# Patient Record
Sex: Female | Born: 1985
Health system: Southern US, Community
[De-identification: ages and names within clinical notes are randomized; demographics above are authoritative.]

## PROBLEM LIST (undated history)

## (undated) DIAGNOSIS — H9190 Unspecified hearing loss, unspecified ear: Secondary | ICD-10-CM

## (undated) DIAGNOSIS — Z9889 Other specified postprocedural states: Secondary | ICD-10-CM

## (undated) DIAGNOSIS — J029 Acute pharyngitis, unspecified: Secondary | ICD-10-CM

## (undated) DIAGNOSIS — F419 Anxiety disorder, unspecified: Secondary | ICD-10-CM

## (undated) DIAGNOSIS — Z8619 Personal history of other infectious and parasitic diseases: Secondary | ICD-10-CM

## (undated) DIAGNOSIS — R112 Nausea with vomiting, unspecified: Secondary | ICD-10-CM

## (undated) HISTORY — DX: Unspecified hearing loss, unspecified ear: H91.90

## (undated) HISTORY — PX: TYMPANOSTOMY TUBE PLACEMENT: SHX32

## (undated) HISTORY — DX: Personal history of other infectious and parasitic diseases: Z86.19

## (undated) HISTORY — PX: LINGUAL FRENECTOMY: SHX6357

## (undated) HISTORY — PX: WISDOM TOOTH EXTRACTION: SHX21

---

## 2005-03-16 ENCOUNTER — Emergency Department (HOSPITAL_COMMUNITY): Admission: EM | Admit: 2005-03-16 | Discharge: 2005-03-16 | Payer: Self-pay | Admitting: Emergency Medicine

## 2010-03-02 ENCOUNTER — Encounter: Payer: Self-pay | Admitting: Cardiology

## 2010-04-16 ENCOUNTER — Encounter: Payer: Self-pay | Admitting: Cardiology

## 2010-09-05 ENCOUNTER — Encounter: Payer: Self-pay | Admitting: Cardiology

## 2011-06-21 LAB — STREP B DNA PROBE: GBS: POSITIVE

## 2011-06-21 LAB — GC/CHLAMYDIA PROBE AMP, GENITAL: Gonorrhea: NEGATIVE

## 2011-08-08 ENCOUNTER — Encounter: Payer: Self-pay | Admitting: Cardiology

## 2011-11-20 ENCOUNTER — Inpatient Hospital Stay (HOSPITAL_COMMUNITY): Admission: AD | Admit: 2011-11-20 | Payer: Self-pay | Source: Ambulatory Visit | Admitting: Obstetrics and Gynecology

## 2011-11-21 NOTE — L&D Delivery Note (Signed)
Delivery Note  Pt began to have more urge to push about 2030, by 2130 cx was complete and pt pushed well in tub, in SF position, vertex began to  Crown, FHR remained stable at 140's intermittently.   At 10:49 PM a viable female was delivered via Vaginal, Spontaneous Delivery (Presentation: LOA  ). Shoulders followed easily and delivered through loose nuchal cord, infant did not initially respond to tactile stim, an dcord was clamped and cut by FOB and taken to warmer.  APGAR: 2, 7; weight 5 lb 15.8 oz (2716 g).   Placenta status: Intact, Spontaneous.  Cord: 3 vessels with the following complications: None.   Routine cord blood collected  Anesthesia: Local  Episiotomy: None Lacerations: bilateral Labial;1st degree;Perineal Suture Repair: 3.0 vicryl rapide Est. Blood Loss (mL): 200  Mom to postpartum.  Baby to nursery-stable. Taken to central nursery for further observation Mom plans to BF and plans IP circumcision Dr Estanislado Pandy notified  Malissa Hippo 01/28/2012, 12:53 AM

## 2011-12-22 ENCOUNTER — Encounter: Payer: Self-pay | Admitting: Cardiology

## 2012-01-19 ENCOUNTER — Encounter (INDEPENDENT_AMBULATORY_CARE_PROVIDER_SITE_OTHER): Payer: BC Managed Care – PPO

## 2012-01-19 DIAGNOSIS — Z331 Pregnant state, incidental: Secondary | ICD-10-CM

## 2012-01-25 ENCOUNTER — Encounter (INDEPENDENT_AMBULATORY_CARE_PROVIDER_SITE_OTHER): Payer: BC Managed Care – PPO | Admitting: Obstetrics and Gynecology

## 2012-01-25 DIAGNOSIS — Z331 Pregnant state, incidental: Secondary | ICD-10-CM

## 2012-01-27 ENCOUNTER — Inpatient Hospital Stay (HOSPITAL_COMMUNITY)
Admission: AD | Admit: 2012-01-27 | Discharge: 2012-01-29 | DRG: 373 | Disposition: A | Discharge status: Home / Self Care | Payer: BC Managed Care – PPO | Source: Ambulatory Visit | Attending: Obstetrics and Gynecology | Admitting: Obstetrics and Gynecology

## 2012-01-27 ENCOUNTER — Encounter (HOSPITAL_COMMUNITY): Payer: Self-pay | Admitting: *Deleted

## 2012-01-27 DIAGNOSIS — IMO0002 Reserved for concepts with insufficient information to code with codable children: Secondary | ICD-10-CM

## 2012-01-27 DIAGNOSIS — Z22330 Carrier of Group B streptococcus: Secondary | ICD-10-CM

## 2012-01-27 DIAGNOSIS — O99892 Other specified diseases and conditions complicating childbirth: Secondary | ICD-10-CM | POA: Diagnosis present

## 2012-01-27 LAB — HEPATITIS B SURFACE ANTIGEN: Hepatitis B Surface Ag: NEGATIVE

## 2012-01-27 LAB — CBC
HCT: 35.3 % — ABNORMAL LOW (ref 36.0–46.0)
Hemoglobin: 12.1 g/dL (ref 12.0–15.0)
MCH: 31.8 pg (ref 26.0–34.0)
MCHC: 34.3 g/dL (ref 30.0–36.0)
RBC: 3.8 MIL/uL — ABNORMAL LOW (ref 3.87–5.11)

## 2012-01-27 LAB — RPR
RPR Ser Ql: NONREACTIVE
RPR: NONREACTIVE

## 2012-01-27 MED ORDER — BUTORPHANOL TARTRATE 2 MG/ML IJ SOLN: 1.0000 mg | INTRAMUSCULAR | Status: DC | PRN

## 2012-01-27 MED ORDER — OXYTOCIN BOLUS FROM INFUSION
500.0000 mL | Freq: Once | INTRAVENOUS | Status: DC
  Filled 2012-01-27: qty 500
  Filled 2012-01-27: qty 1000

## 2012-01-27 MED ORDER — OXYCODONE-ACETAMINOPHEN 5-325 MG PO TABS: 1.0000 | ORAL_TABLET | ORAL | Status: DC | PRN

## 2012-01-27 MED ORDER — VANCOMYCIN HCL IN DEXTROSE 1-5 GM/200ML-% IV SOLN
1000.0000 mg | Freq: Two times a day (BID) | INTRAVENOUS | Status: DC
  Administered 2012-01-27: 1000 mg via INTRAVENOUS
  Filled 2012-01-27 (×3): qty 200

## 2012-01-27 MED ORDER — IBUPROFEN 600 MG PO TABS
600.0000 mg | ORAL_TABLET | Freq: Four times a day (QID) | ORAL | Status: DC | PRN
  Administered 2012-01-27: 600 mg via ORAL
  Filled 2012-01-27: qty 1

## 2012-01-27 MED ORDER — OXYTOCIN 10 UNIT/ML IJ SOLN
INTRAMUSCULAR | Status: AC
  Filled 2012-01-27: qty 2

## 2012-01-27 MED ORDER — ACETAMINOPHEN 325 MG PO TABS: 650.0000 mg | ORAL_TABLET | ORAL | Status: DC | PRN

## 2012-01-27 MED ORDER — LIDOCAINE HCL (PF) 1 % IJ SOLN
30.0000 mL | INTRAMUSCULAR | Status: DC | PRN
  Administered 2012-01-27: 30 mL via SUBCUTANEOUS
  Filled 2012-01-27: qty 30

## 2012-01-27 MED ORDER — LACTATED RINGERS IV SOLN: 500.0000 mL | INTRAVENOUS | Status: DC | PRN

## 2012-01-27 MED ORDER — OXYTOCIN 20 UNITS IN LACTATED RINGERS INFUSION - SIMPLE: 125.0000 mL/h | Freq: Once | INTRAVENOUS | Status: DC

## 2012-01-27 MED ORDER — FLEET ENEMA 7-19 GM/118ML RE ENEM: 1.0000 | ENEMA | RECTAL | Status: DC | PRN

## 2012-01-27 MED ORDER — PROMETHAZINE HCL 25 MG/ML IJ SOLN: 12.5000 mg | INTRAMUSCULAR | Status: DC | PRN

## 2012-01-27 MED ORDER — CITRIC ACID-SODIUM CITRATE 334-500 MG/5ML PO SOLN: 30.0000 mL | ORAL | Status: DC | PRN

## 2012-01-27 MED ORDER — ONDANSETRON HCL 4 MG/2ML IJ SOLN: 4.0000 mg | Freq: Four times a day (QID) | INTRAMUSCULAR | Status: DC | PRN

## 2012-01-27 MED ORDER — LACTATED RINGERS IV SOLN
INTRAVENOUS | Status: DC
  Administered 2012-01-27: 14:00:00 via INTRAVENOUS

## 2012-01-27 NOTE — Progress Notes (Signed)
Pt in tub-temp 98 degrees

## 2012-01-27 NOTE — Progress Notes (Signed)
Pt in pool--water temp 97

## 2012-01-27 NOTE — Progress Notes (Signed)
Water Temp 96

## 2012-01-27 NOTE — Progress Notes (Signed)
Calm, quiet in tub O FHTS +     BP 124/66  Pulse 82  Temp(Src) 97.8 F (36.6 C) (Oral)  Resp 18  Ht 5' (1.524 m)  Wt 62.143 kg (137 lb)  BMI 26.76 kg/m2 Hemoglobin & Hematocrit     Component Value Date/Time   HGB 12.1 01/27/2012 1330   HCT 35.3* 01/27/2012 1330   UC q 2-3 Vag not assessed A 39 1/7 week IUP labor P continue care Lavera Guise, CNM

## 2012-01-27 NOTE — H&P (Signed)
Kimberly Chambers is a 26 y.o. female presenting with srom at 0900, uc pain level 4, denies vag bleeding, with +FM, plans water birth. Allergy: augmentin and ceclor- rash and throat edema with both History OB History    Grav Para Term Preterm Abortions TAB SAB Ect Mult Living   1              Past Medical History  Diagnosis Date  . No pertinent past medical history    Past Surgical History  Procedure Date  . Wisdom tooth extraction    Family History: family history is not on file. Social History:  reports that she has quit smoking. She has never used smokeless tobacco. She reports that she does not drink alcohol or use illicit drugs.  ROS  Dilation: 3.5 Effacement (%): 100 Station: -2;-1 Exam by:: M. Chiquita Loth CNM Blood pressure 124/84, pulse 110, temperature 98 F (36.7 C), temperature source Oral, resp. rate 16, height 5' (1.524 m), weight 62.143 kg (137 lb). Exam Physical Exam alert, cooperative, lungs clear bilaterally, AP RRR, abd soft, gravid, nt SSE bloody mucus seen, vag 3 100 -1/-2 VTX no membranes palpated, 2 cm clot with exam Prenatal labs: 1 gtt 161, 3 gtt WNL ABO, Rh:  A+ Antibody:  neg Rubella:  I RPR:   NR HBsAg:   Neg HIV:   NR GBS:   positive urine  Assessment/Plan: 39 1/7 week IUP SROM GBS+ P IV vancomycin, observe for bleeding through admission it without further bleeding then intermittent fhts, collaboration with Dr. Estanislado Pandy.  Chizuko Trine 01/27/2012, 2:16 PM

## 2012-01-27 NOTE — Progress Notes (Signed)
Calm quiet with uc O Fhts +     uc q 2-3 mod     abd soft, gravid, nt     Vag 6 100 0 VTX R A active labor P continue care Lavera Guise, CNM

## 2012-01-27 NOTE — Progress Notes (Signed)
Patient ID: Kimberly Chambers, female   DOB: 08/16/1986, 26 y.o.   MRN: [id number] .Subjective: Doing well, pushing, in the tub, husband and mother in room, drinking PO   Objective: BP 142/83  Pulse 114  Temp(Src) 98.7 F (37.1 C) (Oral)  Resp 20  Ht 5' (1.524 m)  Wt 62.143 kg (137 lb)  BMI 26.76 kg/m2   FHT:  125 intermittent UC:   regular, every 1-2 minutes SVE:   Dilation: 10 Effacement (%): 100 Station: +1 Exam by:: Tristy Udovich cnm    Assessment / Plan: Spontaneous labor, progressing normally GBS pos, rcv'd vanc   Fetal Wellbeing:  Category I Pain Control:  Labor support without medications  Update physician PRN  Hatim Homann M 01/27/2012, 9:32 PM

## 2012-01-27 NOTE — Progress Notes (Signed)
C/o urge to push O Fhts +     uc q 2-3 od     Vag 5 100 -1/0 no vag bleeding A active labor P continue care Lavera Guise, CNM

## 2012-01-28 ENCOUNTER — Encounter (HOSPITAL_COMMUNITY): Payer: Self-pay | Admitting: *Deleted

## 2012-01-28 DIAGNOSIS — Z22330 Carrier of Group B streptococcus: Secondary | ICD-10-CM

## 2012-01-28 LAB — CBC
HCT: 32.7 % — ABNORMAL LOW (ref 36.0–46.0)
Hemoglobin: 11.4 g/dL — ABNORMAL LOW (ref 12.0–15.0)
MCH: 32 pg (ref 26.0–34.0)
MCHC: 34.9 g/dL (ref 30.0–36.0)
MCV: 91.9 fL (ref 78.0–100.0)
Platelets: 237 10*3/uL (ref 150–400)
RBC: 3.56 MIL/uL — ABNORMAL LOW (ref 3.87–5.11)
RDW: 12.6 % (ref 11.5–15.5)
WBC: 44.5 10*3/uL — ABNORMAL HIGH (ref 4.0–10.5)

## 2012-01-28 MED ORDER — DIBUCAINE 1 % RE OINT: 1.0000 "application " | TOPICAL_OINTMENT | RECTAL | Status: DC | PRN

## 2012-01-28 MED ORDER — PRENATAL MULTIVITAMIN CH
1.0000 | ORAL_TABLET | Freq: Every day | ORAL | Status: DC
  Administered 2012-01-28 – 2012-01-29 (×2): 1 via ORAL
  Filled 2012-01-28 (×2): qty 1

## 2012-01-28 MED ORDER — DIPHENHYDRAMINE HCL 25 MG PO CAPS: 25.0000 mg | ORAL_CAPSULE | Freq: Four times a day (QID) | ORAL | Status: DC | PRN

## 2012-01-28 MED ORDER — ONDANSETRON HCL 4 MG/2ML IJ SOLN: 4.0000 mg | INTRAMUSCULAR | Status: DC | PRN

## 2012-01-28 MED ORDER — LANOLIN HYDROUS EX OINT: TOPICAL_OINTMENT | CUTANEOUS | Status: DC | PRN

## 2012-01-28 MED ORDER — WITCH HAZEL-GLYCERIN EX PADS: 1.0000 "application " | MEDICATED_PAD | CUTANEOUS | Status: DC | PRN

## 2012-01-28 MED ORDER — BENZOCAINE-MENTHOL 20-0.5 % EX AERO
INHALATION_SPRAY | CUTANEOUS | Status: AC
  Administered 2012-01-28: 04:00:00
  Filled 2012-01-28: qty 56

## 2012-01-28 MED ORDER — MEASLES, MUMPS & RUBELLA VAC ~~LOC~~ INJ
0.5000 mL | INJECTION | Freq: Once | SUBCUTANEOUS | Status: DC
  Filled 2012-01-28: qty 0.5

## 2012-01-28 MED ORDER — IBUPROFEN 600 MG PO TABS
600.0000 mg | ORAL_TABLET | Freq: Four times a day (QID) | ORAL | Status: DC
  Administered 2012-01-28 – 2012-01-29 (×6): 600 mg via ORAL
  Filled 2012-01-28 (×6): qty 1

## 2012-01-28 MED ORDER — SENNOSIDES-DOCUSATE SODIUM 8.6-50 MG PO TABS
2.0000 | ORAL_TABLET | Freq: Every day | ORAL | Status: DC
  Administered 2012-01-29: 2 via ORAL

## 2012-01-28 MED ORDER — LORATADINE 10 MG PO TABS
10.0000 mg | ORAL_TABLET | Freq: Every day | ORAL | Status: DC
  Filled 2012-01-28 (×3): qty 1

## 2012-01-28 MED ORDER — SIMETHICONE 80 MG PO CHEW: 80.0000 mg | CHEWABLE_TABLET | ORAL | Status: DC | PRN

## 2012-01-28 MED ORDER — ZOLPIDEM TARTRATE 5 MG PO TABS: 5.0000 mg | ORAL_TABLET | Freq: Every evening | ORAL | Status: DC | PRN

## 2012-01-28 MED ORDER — OXYCODONE-ACETAMINOPHEN 5-325 MG PO TABS: 1.0000 | ORAL_TABLET | ORAL | Status: DC | PRN

## 2012-01-28 MED ORDER — TETANUS-DIPHTH-ACELL PERTUSSIS 5-2.5-18.5 LF-MCG/0.5 IM SUSP: 0.5000 mL | Freq: Once | INTRAMUSCULAR | Status: DC

## 2012-01-28 MED ORDER — BENZOCAINE-MENTHOL 20-0.5 % EX AERO: 1.0000 "application " | INHALATION_SPRAY | CUTANEOUS | Status: DC | PRN

## 2012-01-28 MED ORDER — MENTHOL 3 MG MT LOZG
1.0000 | LOZENGE | OROMUCOSAL | Status: DC | PRN
  Filled 2012-01-28: qty 9

## 2012-01-28 MED ORDER — ONDANSETRON HCL 4 MG PO TABS: 4.0000 mg | ORAL_TABLET | ORAL | Status: DC | PRN

## 2012-01-28 NOTE — Progress Notes (Signed)
Comfortable except for cramping with pumping, little bleeding, baby in NICU O VSS     abd soft, nt, ff sm rubra flow, perineum clean intact, no edema, -Homan's sign bilaterally      Hemoglobin & Hematocrit     Component Value Date/Time   HGB 11.4* 01/28/2012 0537   HCT 32.7* 01/28/2012 0537   A pp day 1    Lactating     Normal involution P repeat CBC, encouraged frequent voids. Lavera Guise, CNM

## 2012-01-29 LAB — DIFFERENTIAL
Basophils Absolute: 0 10*3/uL (ref 0.0–0.1)
Basophils Relative: 0 % (ref 0–1)
Lymphocytes Relative: 12 % (ref 12–46)
Neutro Abs: 20.2 10*3/uL — ABNORMAL HIGH (ref 1.7–7.7)
Neutrophils Relative %: 80 % — ABNORMAL HIGH (ref 43–77)

## 2012-01-29 LAB — CBC
MCHC: 34.6 g/dL (ref 30.0–36.0)
Platelets: 196 10*3/uL (ref 150–400)
RDW: 12.9 % (ref 11.5–15.5)
WBC: 25.3 10*3/uL — ABNORMAL HIGH (ref 4.0–10.5)

## 2012-01-29 MED ORDER — IBUPROFEN 600 MG PO TABS: 600.0000 mg | ORAL_TABLET | Freq: Four times a day (QID) | ORAL | Status: AC | PRN

## 2012-01-29 MED ORDER — FERROUS SULFATE 325 (65 FE) MG PO TABS: 325.0000 mg | ORAL_TABLET | Freq: Every day | ORAL | Status: AC

## 2012-01-29 NOTE — Discharge Instructions (Signed)
Vaginal Delivery Care After  Change your pad on each trip to the bathroom.   Wipe gently with toilet paper during your hospital stay. Always wipe from front to back. A spray bottle with warm tap water could also be used or a towelette if available.   Place your soiled pad and toilet paper in a bathroom wastebasket with a plastic bag liner.   During your hospital stay, save any clots. If you pass a clot while on the toilet, do not flush it. Also, if your vaginal flow seems excessive to you, notify nursing personnel.   The first time you get out of bed after delivery, wait for assistance from a nurse. Do not get up alone at any time if you feel weak or dizzy.   Bend and extend your ankles forcefully so that you feel the calves of your legs get hard. Do this 6 times every hour when you are in bed and awake.   Do not sit with one foot under you, dangle your legs over the edge of the bed, or maintain a position that hinders the circulation in your legs.   Many women experience after pains for 2 to 3 days after delivery. These after pains are mild uterine contractions. Ask the nurse for a pain medication if you need something for this. Sometimes breastfeeding stimulates after pains; if you find this to be true, ask for the medication  -  hour before the next feeding.   For you and your infant's protection, do not go beyond the door(s) of the obstetric unit. Do not carry your baby in your arms in the hallway. When taking your baby to and from your room, put your baby in the bassinet and push the bassinet.   Mothers may have their babies in their room as much as they desire.   Breastfeeding BENEFITS OF BREASTFEEDING For the baby  The first milk (colostrum) helps the baby's digestive system function better.   There are antibodies from the mother in the milk that help the baby fight off infections.   The baby has a lower incidence of asthma, allergies, and SIDS (sudden infant death syndrome).     The nutrients in breast milk are better than formulas for the baby and helps the baby's brain grow better.   Babies who breastfeed have less gas, colic, and constipation.  For the mother  Breastfeeding helps develop a very special bond between mother and baby.   It is more convenient, always available at the correct temperature and cheaper than formula feeding.   It burns calories in the mother and helps with losing weight that was gained during pregnancy.   It makes the uterus contract back down to normal size faster and slows bleeding following delivery.   Breastfeeding mothers have a lower risk of developing breast cancer.  NURSE FREQUENTLY  A healthy, full-term baby may breastfeed as often as every hour or space his or her feedings to every 3 hours.   How often to nurse will vary from baby to baby. Watch your baby for signs of hunger, not the clock.   Nurse as often as the baby requests, or when you feel the need to reduce the fullness of your breasts.   Awaken the baby if it has been 3 to 4 hours since the last feeding.   Frequent feeding will help the mother make more milk and will prevent problems like sore nipples and engorgement of the breasts.  BABY'S POSITION AT THE BREAST    Whether lying down or sitting, be sure that the baby's tummy is facing your tummy.   Support the breast with 4 fingers underneath the breast and the thumb above. Make sure your fingers are well away from the nipple and baby's mouth.   Stroke the baby's lips and cheek closest to the breast gently with your finger or nipple.   When the baby's mouth is open wide enough, place all of your nipple and as much of the dark area around the nipple as possible into your baby's mouth.   Pull the baby in close so the tip of the nose and the baby's cheeks touch the breast during the feeding.  FEEDINGS  The length of each feeding varies from baby to baby and from feeding to feeding.   The baby must suck  about 2 to 3 minutes for your milk to get to him or her. This is called a "let down." For this reason, allow the baby to feed on each breast as long as he or she wants. Your baby will end the feeding when he or she has received the right balance of nutrients.   To break the suction, put your finger into the corner of the baby's mouth and slide it between his or her gums before removing your breast from his or her mouth. This will help prevent sore nipples.  REDUCING BREAST ENGORGEMENT  In the first week after your baby is born, you may experience signs of breast engorgement. When breasts are engorged, they feel heavy, warm, full, and may be tender to the touch. You can reduce engorgement if you:   Nurse frequently, every 2 to 3 hours. Mothers who breastfeed early and often have fewer problems with engorgement.   Place light ice packs on your breasts between feedings. This reduces swelling. Wrap the ice packs in a lightweight towel to protect your skin.   Apply moist hot packs to your breast for 5 to 10 minutes before each feeding. This increases circulation and helps the milk flow.   Gently massage your breast before and during the feeding.   Make sure that the baby empties at least one breast at every feeding before switching sides.   Use a breast pump to empty the breasts if your baby is sleepy or not nursing well. You may also want to pump if you are returning to work or or you feel you are getting engorged.   Avoid bottle feeds, pacifiers or supplemental feedings of water or juice in place of breastfeeding.   Be sure the baby is latched on and positioned properly while breastfeeding.   Prevent fatigue, stress, and anemia.   Wear a supportive bra, avoiding underwire styles.   Eat a balanced diet with enough fluids.  If you follow these suggestions, your engorgement should improve in 24 to 48 hours. If you are still experiencing difficulty, call your lactation consultant or  caregiver. IS MY BABY GETTING ENOUGH MILK? Sometimes, mothers worry about whether their babies are getting enough milk. You can be assured that your baby is getting enough milk if:  The baby is actively sucking and you hear swallowing.   The baby nurses at least 8 to 12 times in a 24 hour time period. Nurse your baby until he or she unlatches or falls asleep at the first breast (at least 10 to 20 minutes), then offer the second side.   The baby is wetting 5 to 6 disposable diapers (6 to 8 cloth diapers) in a   24 hour period by 5 to 6 days of age.   The baby is having at least 2 to 3 stools every 24 hours for the first few months. Breast milk is all the food your baby needs. It is not necessary for your baby to have water or formula. In fact, to help your breasts make more milk, it is best not to give your baby supplemental feedings during the early weeks.   The stool should be soft and yellow.   The baby should gain 4 to 7 ounces per week after he is 4 days old.  TAKE CARE OF YOURSELF Take care of your breasts by:  Bathing or showering daily.   Avoiding the use of soaps on your nipples.   Start feedings on your left breast at one feeding and on your right breast at the next feeding.   You will notice an increase in your milk supply 2 to 5 days after delivery. You may feel some discomfort from engorgement, which makes your breasts very firm and often tender. Engorgement "peaks" out within 24 to 48 hours. In the meantime, apply warm moist towels to your breasts for 5 to 10 minutes before feeding. Gentle massage and expression of some milk before feeding will soften your breasts, making it easier for your baby to latch on. Wear a well fitting nursing bra and air dry your nipples for 10 to 15 minutes after each feeding.   Only use cotton bra pads.   Only use pure lanolin on your nipples after nursing. You do not need to wash it off before nursing.  Take care of yourself by:   Eating  well-balanced meals and nutritious snacks.   Drinking milk, fruit juice, and water to satisfy your thirst (about 8 glasses a day).   Getting plenty of rest.   Increasing calcium in your diet (1200 mg a day).   Avoiding foods that you notice affect the baby in a bad way.  SEEK MEDICAL CARE IF:   You have any questions or difficulty with breastfeeding.   You need help.   You have a hard, red, sore area on your breast, accompanied by a fever of 100.5 F (38.1 C) or more.   Your baby is too sleepy to eat well or is having trouble sleeping.   Your baby is wetting less than 6 diapers per day, by 5 days of age.   Your baby's skin or white part of his or her eyes is more yellow than it was in the hospital.   You feel depressed.  Document Released: 11/06/2005 Document Revised: 10/26/2011 Document Reviewed: 06/21/2009 ExitCare Patient Information 2012 [employer]. 

## 2012-01-29 NOTE — Discharge Summary (Signed)
  Obstetric Discharge Summary Reason for Admission: rupture of membranes Prenatal Procedures: ultrasound Intrapartum Procedures: spontaneous vaginal delivery Postpartum Procedures: antibiotics Complications-Operative and Postpartum: bilateral labial and first  degree perineal laceration  Temp:  [97.6 F (36.4 C)-98.6 F (37 C)] 98.4 F (36.9 C) (03/11 2952) Pulse Rate:  [79-92] 79  (03/11 0608) Resp:  [18-20] 18  (03/11 0608) BP: (97-126)/(64-82) 97/64 mmHg (03/11 0608) Hemoglobin  Date Value Range Status  01/29/2012 9.9* 12.0-15.0 (g/dL) Final     HCT  Date Value Range Status  01/29/2012 28.6* 36.0-46.0 (%) Final   S: Patient stable denies complaints. Afebrile, without signs or symptoms of infection noted. Pumping. Baby in NICU.   O: VSS  Assessment/Plan General: alert, oriented, coping well Lochia: scant, fundus firm 3 below umbilicus Negative Homan's- no signs of DVT +1 edema noted BLE RTO x 6 weeks for PP exam-call for appt. Declines contraception presently-may decide Micronor Other BCM options discussed Nothing in vagina x 6 weeks   Hospital Course:  Hospital Course: Admitted after SROM. Positive GBS. Progressed to fully dilated. Delivery was performed by Gevena Barre, CNM without difficulty. Patient and baby tolerated the procedure without difficulty, with a bilateral labial laceration and 1st degree perineal laceration noted. Infant to nursery, later to NICU. Mother and infant then had an uncomplicated postpartum course, with pumping going well. Mom's physical exam was WNL, and she was discharged home in stable condition. Contraception plan was abstinence.  She received adequate benefit from po pain medications.  Discharge Diagnoses: Term Pregnancy-delivered  Discharge Information: Date: 01/29/2012 Activity: nothing in vagina x 6 weeks Diet: routine Medications:  Medication List  As of 01/29/2012  9:25 AM   START taking these medications         ferrous sulfate 325  (65 FE) MG tablet   Take 1 tablet (325 mg total) by mouth daily with breakfast.      ibuprofen 600 MG tablet   Commonly known as: ADVIL,MOTRIN   Take 1 tablet (600 mg total) by mouth every 6 (six) hours as needed for pain.         CONTINUE taking these medications         cetirizine 10 MG tablet   Commonly known as: ZYRTEC      prenatal multivitamin Tabs          Where to get your medications    These are the prescriptions that you need to pick up.   You may get these medications from any pharmacy.         ferrous sulfate 325 (65 FE) MG tablet   ibuprofen 600 MG tablet           Condition: stable Instructions: refer to practice specific booklet Discharge to: home Follow-up Information    Follow up with [facility]. Schedule an appointment as soon as possible for a visit in 6 weeks.         Newborn Data: Live born  Information for the patient's newborn:  Bade, Boy Arnitra [[id number]]  female ; APGAR 2,7 ; weight 5lbs. 15.8 oz  Baby in NICU  Kizzie Fantasia CORI 01/29/2012, 9:25 AM  Agree with above - AYR

## 2012-01-29 NOTE — Progress Notes (Signed)
CLINICAL SOCIAL WORK  BRIEF PSYCHOSOCIAL ASSESSMENT  Referred by: NICU     On: 01/29/12      For: NICU support     Patient Interview _X_Family Interview: parents     Other: MOB's chart   PSYCHOSOCIAL DATA:   Lives Alone  Lives with: baby to be discharged to parents' home  Primary Support (Name/Relationship): Kimberly Chambers/parents Degree of support available:   CURRENT CONCERNS:     _X_None noted Substance Abuse     Behavioral Health Issues    Financial Resources     Abuse/Neglect/Domestic Violence   Cultural/Religious Issues     Post-Acute Placement    Adjustment to Illness     Knowledge/Cognitive Deficit      Other:     SOCIAL WORK ASSESSMENT/PLAN:  SW met with parents in MOB's first floor room to introduce myself, complete assessment and evaluate how they are coping with baby's admission to NICU.  SW explained support services offered by NICU SW and asked if parents have any questions or needs at this time.  SW left messages at the [facility] Department regarding parents' question about home post partum nurse follow up program.  SW also left message for Coordinator of that program in [place] for information.  SW has no social concerns at this time and does not identify any barriers to discharge.  No Further Intervention Required  _X_Psychosocial Support/Ongoing Assessment of Needs Information/Referral to [facility] Other  PATIENT'S/FAMILY'S RESPONSE TO PLAN OF CARE:  Parents were very friendly and state they think baby will be able to go home tomorrow and that they will be able to room in tonight since it is MOB's day of discharge.  They state no concerns or needs and only question was regarding PP nurse follow up program.

## 2012-01-31 ENCOUNTER — Encounter: Payer: BC Managed Care – PPO | Admitting: Obstetrics and Gynecology

## 2012-02-02 ENCOUNTER — Encounter (HOSPITAL_COMMUNITY): Payer: Self-pay

## 2012-03-14 ENCOUNTER — Ambulatory Visit (INDEPENDENT_AMBULATORY_CARE_PROVIDER_SITE_OTHER): Payer: BC Managed Care – PPO | Admitting: Obstetrics and Gynecology

## 2012-03-14 ENCOUNTER — Encounter: Payer: Self-pay | Admitting: Obstetrics and Gynecology

## 2012-03-14 DIAGNOSIS — Z309 Encounter for contraceptive management, unspecified: Secondary | ICD-10-CM | POA: Insufficient documentation

## 2012-03-14 NOTE — Patient Instructions (Signed)
Intrauterine Device Information An intrauterine device (IUD) is inserted into your uterus and prevents pregnancy. There are 2 types of IUDs available:  Copper IUD. This type of IUD is wrapped in copper wire and is placed inside the uterus. Copper makes the uterus and fallopian tubes produce a fluid that kills sperm. The copper IUD can stay in place for 10 years.   Hormone IUD. This type of IUD contains the hormone progestin (synthetic progesterone). The hormone thickens the cervical mucus and prevents sperm from entering the uterus, and it also thins the uterine lining to prevent implantation of a fertilized egg. The hormone can weaken or kill the sperm that get into the uterus. The hormone IUD can stay in place for 5 years.  Your caregiver will make sure you are a good candidate for a contraceptive IUD. Discuss with your caregiver the possible side effects. ADVANTAGES  It is highly effective, reversible, long-acting, and low maintenance.   There are no estrogen-related side effects.   An IUD can be used when breastfeeding.   It is not associated with weight gain.   It works immediately after insertion.   The copper IUD does not interfere with your female hormones.   The progesterone IUD can make heavy menstrual periods lighter.   The progesterone IUD can be used for 5 years.   The copper IUD can be used for 10 years.  DISADVANTAGES  The progesterone IUD can be associated with irregular bleeding patterns.   The copper IUD can make your menstrual flow heavier and more painful.   You may experience cramping and vaginal bleeding after insertion.  Document Released: 10/10/2004 Document Revised: 10/26/2011 Document Reviewed: 03/11/2011 ExitCare Patient Information 2012 [employer]. 

## 2012-03-14 NOTE — Progress Notes (Deleted)
DATE OR SURGERY: January 27, 2012 PAIN:{EXAM; YES/NO:19492::"No"} VAG BLEEDING: {yes no:314532} VAG DISCHARGE: {yes no:314532} NORMAL GI FUNCTN: {yes no:314532} NORMAL GU FUNCTN: {yes ZO:[id number]}

## 2012-03-14 NOTE — Progress Notes (Signed)
Date of delivery: January 27, 2012 Female Name: Kimberly Chambers Vaginal delivery:yes Cesarean section:no Tubal ligation:no GDM:no Breast Feeding:yes Bottle Feeding:yes Breastmilk Post-Partum Blues:no Abnormal pap:no Normal GU function: yes Normal GI function:yes Returning to work:yes Aug. 2013 Desires BC w/o hormones wants MD suggestion, BC option lists given today EPDS 6  Subjective:     Kimberly Chambers is a 26 y.o. female who presents for a postpartum visit.  I have fully reviewed the prenatal and intrapartum course.    Patient is sexually active.   The following portions of the patient's history were reviewed and updated as appropriate: allergies, current medications, past family history, past medical history, past social history, past surgical history and problem list.  Review of Systems Pertinent items are noted in HPI.   Objective:    BP 88/58  Wt 121 lb (54.885 kg)  Breastfeeding? Yes  General:  alert, cooperative and no distress     Lungs: clear to auscultation bilaterally  Heart:  regular rate and rhythm, S1, S2 normal, no murmur  Abdomen: soft, non-tender; bowel sounds normal; no masses,  no organomegaly   Vulva:  normal  Vagina: normal vagina  Cervix:  normal  Corpus: normal size, contour, position, consistency, mobility, non-tender  Adnexa:  normal adnexa             Assessment:     Normal postpartum exam.  Pap smear not done at today's visit due January 2014.   Plan:    1. Contraception: After discussion of options for non-hormone containing contraceptives, pt wants Paragard IUD 2.. Follow up for IUD insertion 3.  Written info given    Kimberly Morales MD 03/14/2012 6:02 PM

## 2012-03-27 ENCOUNTER — Ambulatory Visit (INDEPENDENT_AMBULATORY_CARE_PROVIDER_SITE_OTHER): Payer: BC Managed Care – PPO | Admitting: Obstetrics and Gynecology

## 2012-03-27 ENCOUNTER — Encounter: Payer: Self-pay | Admitting: Obstetrics and Gynecology

## 2012-03-27 VITALS — BP 92/62 | HR 60 | Ht 60.0 in | Wt 117.0 lb

## 2012-03-27 DIAGNOSIS — B1910 Unspecified viral hepatitis B without hepatic coma: Secondary | ICD-10-CM

## 2012-03-27 DIAGNOSIS — Z30430 Encounter for insertion of intrauterine contraceptive device: Secondary | ICD-10-CM

## 2012-03-27 DIAGNOSIS — Z975 Presence of (intrauterine) contraceptive device: Secondary | ICD-10-CM

## 2012-03-27 DIAGNOSIS — Z8619 Personal history of other infectious and parasitic diseases: Secondary | ICD-10-CM

## 2012-03-27 LAB — POCT URINE PREGNANCY: Preg Test, Ur: NEGATIVE

## 2012-03-27 NOTE — Patient Instructions (Signed)
Schedule follow up appointment in 4 weeks  Call [facility] [phone]:  -for temperature of 100.4 degrees Fahrenheit or more -pain not improved with over the counter pain medications (Ibuprofen, Advil, Aleve,        Tylenol or acetaminophen) -for excessive bleeding (more than a usual period) -for any other concerns  Do not place anything in your vagina for the next 7 days

## 2012-03-27 NOTE — Progress Notes (Signed)
IUD INSERTION NOTE Kimberly Chambers is a 26 y.o. female G1P1001 who presents for IUD insertion. Read IUD Information Form.  Consent signed after risks and benefits were reviewed including but not limited to bleeding, infection and risk of uterine perforation that may require additional surgery to remove.  LMP: No LMP recorded. Patient is not currently having periods (Reason: Lactating). UPT: negative  IUD  (Paragard)  LOT NUMBER: [id number]  Prepping with Betadine, Hurricane gel applied to anterior lip of cervix prior to placement of tenaculum to the same. Uterus sounded at  6 cm Insertion of Paragard IUD per protocol without any complications  POST-PROCEDURE:  Patient instructed to call with temperature > or = 100.4 degrees Fahrenheit, pain not improved with pain medications taken as instructed or for excessive bleeding. Patient advised to not place anything in the vagina for 7 days after insertion. Patient instructed to check IUD strings after each menstrual cycle   Follow-up:  4 weeks   Carlo Lorson PA-C 03/27/2012 11:35 AM

## 2012-03-27 NOTE — Progress Notes (Signed)
PT STATES NO UNPROTECTED I/C X 14 DAYS  LM

## 2012-04-08 ENCOUNTER — Telehealth: Payer: Self-pay | Admitting: Obstetrics and Gynecology

## 2012-04-08 ENCOUNTER — Encounter: Payer: Self-pay | Admitting: Obstetrics and Gynecology

## 2012-04-08 ENCOUNTER — Ambulatory Visit (INDEPENDENT_AMBULATORY_CARE_PROVIDER_SITE_OTHER): Payer: BC Managed Care – PPO | Admitting: Obstetrics and Gynecology

## 2012-04-08 VITALS — BP 96/60 | HR 62 | Ht 60.0 in | Wt 118.0 lb

## 2012-04-08 DIAGNOSIS — Z30431 Encounter for routine checking of intrauterine contraceptive device: Secondary | ICD-10-CM

## 2012-04-08 NOTE — Telephone Encounter (Signed)
Triage/epic 

## 2012-04-08 NOTE — Progress Notes (Signed)
26 YO with recent IUD insertion(03/27/12)  complains of feeling her strings in her vagina while walking.  Feels like a twinge. Denies pelvic pain, palpation of the device, abnormal bleeding or fever.  O:  Pelvic: EGBUS: wnl, vagina-normal mucosa with long        strings--trimmed, cervix-strings at os (2 cm), uterus-       without tenderness, no palpable device, adnexae-no       masses/tenderness  A: IUD Check-strings trimmed   P: RTO as scheduled or prn  Anastasya Jewell, PA-C

## 2012-04-24 ENCOUNTER — Encounter: Payer: BC Managed Care – PPO | Admitting: Obstetrics and Gynecology

## 2012-07-17 ENCOUNTER — Telehealth: Payer: Self-pay | Admitting: Obstetrics and Gynecology

## 2012-07-17 NOTE — Telephone Encounter (Signed)
Triage/epic 

## 2012-07-17 NOTE — Telephone Encounter (Signed)
Pt called, states has the Mirena, delivered 01/2012, has not had cycles or any issues since insertion.  On 7/28 and again on 8/28 pt experienced a migraine, however pt does have a hx of migraines.  Has also had some cramping that she has not had before, ?'s if she is getting ready to start a cycle.  Pt is also breastfeeding.  Pt advised the first year of having the IUD, may experience cramping or HA's off/on.  Advised pt to keep a diary of what she is experiencing and to call back if sxs worsen.  Also to call if start to have heavy bldg.  Pt voices understanding.

## 2012-09-26 ENCOUNTER — Encounter: Payer: Self-pay | Admitting: Cardiology

## 2014-04-03 ENCOUNTER — Other Ambulatory Visit: Payer: Self-pay

## 2014-04-03 ENCOUNTER — Emergency Department (HOSPITAL_BASED_OUTPATIENT_CLINIC_OR_DEPARTMENT_OTHER)
Admission: EM | Admit: 2014-04-03 | Discharge: 2014-04-03 | Disposition: A | Discharge status: Home / Self Care | Payer: 59 | Attending: Emergency Medicine | Admitting: Emergency Medicine

## 2014-04-03 ENCOUNTER — Encounter (HOSPITAL_BASED_OUTPATIENT_CLINIC_OR_DEPARTMENT_OTHER): Payer: Self-pay | Admitting: Emergency Medicine

## 2014-04-03 DIAGNOSIS — R6883 Chills (without fever): Secondary | ICD-10-CM | POA: Insufficient documentation

## 2014-04-03 DIAGNOSIS — Z880 Allergy status to penicillin: Secondary | ICD-10-CM | POA: Insufficient documentation

## 2014-04-03 DIAGNOSIS — Z888 Allergy status to other drugs, medicaments and biological substances status: Secondary | ICD-10-CM | POA: Insufficient documentation

## 2014-04-03 DIAGNOSIS — F410 Panic disorder [episodic paroxysmal anxiety] without agoraphobia: Secondary | ICD-10-CM | POA: Insufficient documentation

## 2014-04-03 DIAGNOSIS — Z8619 Personal history of other infectious and parasitic diseases: Secondary | ICD-10-CM | POA: Insufficient documentation

## 2014-04-03 LAB — CBC WITH DIFFERENTIAL/PLATELET
BASOS PCT: 0 % (ref 0–1)
Basophils Absolute: 0 10*3/uL (ref 0.0–0.1)
EOS ABS: 0 10*3/uL (ref 0.0–0.7)
Eosinophils Relative: 0 % (ref 0–5)
HEMATOCRIT: 37.5 % (ref 36.0–46.0)
Hemoglobin: 12.9 g/dL (ref 12.0–15.0)
LYMPHS ABS: 2 10*3/uL (ref 0.7–4.0)
LYMPHS PCT: 19 % (ref 12–46)
MCH: 31.8 pg (ref 26.0–34.0)
MCHC: 34.4 g/dL (ref 30.0–36.0)
MCV: 92.4 fL (ref 78.0–100.0)
MONO ABS: 0.6 10*3/uL (ref 0.1–1.0)
Monocytes Relative: 5 % (ref 3–12)
Neutro Abs: 7.9 10*3/uL — ABNORMAL HIGH (ref 1.7–7.7)
Neutrophils Relative %: 75 % (ref 43–77)
Platelets: 268 10*3/uL (ref 150–400)
RBC: 4.06 MIL/uL (ref 3.87–5.11)
RDW: 11.6 % (ref 11.5–15.5)
WBC: 10.5 10*3/uL (ref 4.0–10.5)

## 2014-04-03 LAB — BASIC METABOLIC PANEL
BUN: 14 mg/dL (ref 6–23)
CHLORIDE: 102 meq/L (ref 96–112)
CO2: 27 meq/L (ref 19–32)
CREATININE: 0.8 mg/dL (ref 0.50–1.10)
Calcium: 10.5 mg/dL (ref 8.4–10.5)
GFR calc Af Amer: >90 mL/min (ref >90)
GFR calc non Af Amer: >90 mL/min (ref >90)
Glucose, Bld: 101 mg/dL — ABNORMAL HIGH (ref 70–99)
Potassium: 3.9 mEq/L (ref 3.7–5.3)
Sodium: 141 mEq/L (ref 137–147)

## 2014-04-03 LAB — TSH: TSH: 2.15 u[IU]/mL (ref 0.350–4.500)

## 2014-04-03 LAB — TROPONIN I

## 2014-04-03 NOTE — ED Provider Notes (Addendum)
CSN: [id number]     Arrival date & time 04/03/14  0036 History   First MD Initiated Contact with Patient 04/03/14 0326     Chief Complaint  Patient presents with  . Chills and Chest Pain      (Consider location/radiation/quality/duration/timing/severity/associated sxs/prior Treatment) HPI This is a 28 year old female with a remote history of panic attacks in high school. 4 mornings ago she was awakened at about 3 AM with a sharp, well localized, moderate to severe pain to the left of her sternum. This pain lasted only a few seconds. The pain was followed by generalized shaking, rapid breathing, rapid heartbeat and a sense of anxiety. This resolved after a few minutes. She had a second episode of all of these symptoms excepting the chest pain just prior to arrival here. Symptoms lasted about 10 minutes and resolved spontaneously. She denies any symptoms at the present time. She does not smoke. She is not on birth control pills. She denies leg pain or swelling. She has not been on any prolonged car or air travel recently.  Past Medical History  Diagnosis Date  . H/O candidiasis   . H/O varicella    Past Surgical History  Procedure Laterality Date  . Wisdom tooth extraction      x4, 2002  . Tympanostomy tube placement      as child   . Phrenectomy  2010   Family History  Problem Relation Age of Onset  . Cancer Maternal Aunt   . Hypertension Maternal Grandmother   . Stroke Maternal Grandmother   . Hypertension Maternal Grandfather   . Cancer Paternal Grandmother    History  Substance Use Topics  . Smoking status: Never Smoker   . Smokeless tobacco: Never Used  . Alcohol Use: No   OB History   Grav Para Term Preterm Abortions TAB SAB Ect Mult Living   1 1 1       1      Review of Systems  All other systems reviewed and are negative.   Allergies  Amoxicillin-pot clavulanate; Ceclor; and Cholestatin  Home Medications   Prior to Admission medications   Medication Sig  Start Date End Date Taking? Authorizing Provider  cetirizine (ZYRTEC) 10 MG tablet Take 10 mg by mouth daily as needed. For allergies    Historical Provider, MD  Prenatal Vit-Fe Fumarate-FA (PRENATAL MULTIVITAMIN) TABS Take 1 tablet by mouth every morning.    Historical Provider, MD   BP 118/77  Pulse 92  Temp(Src) 98.5 F (36.9 C) (Oral)  Resp 18  SpO2 0%  Physical Exam General: Well-developed, well-nourished female in no acute distress; appearance consistent with age of record HENT: normocephalic; atraumatic Eyes: pupils equal, round and reactive to light; extraocular muscles intact Neck: supple Heart: regular rate and rhythm; no murmurs, rubs or gallops Lungs: clear to auscultation bilaterally Abdomen: soft; nondistended; nontender; no masses or hepatosplenomegaly; bowel sounds present Extremities: No deformity; full range of motion; pulses normal; no edema; no calf tenderness Neurologic: Awake, alert and oriented; motor function intact in all extremities and symmetric; no facial droop Skin: Warm and dry Psychiatric: Normal mood and affect    ED Course  Procedures (including critical care time)   MDM  EKG Interpretation:  Date & Time: 04/03/2014 12:54 AM  Rate: 107   Rhythm: sinus tachycardia  QRS Axis: normal  Intervals: normal  ST/T Wave abnormalities: inverted T-wave lead V4  Conduction Disutrbances:none  Narrative Interpretation: left atrial enlargement  Old EKG Reviewed: none available  Nursing notes and vitals signs, including pulse oximetry, reviewed.  Summary of this visit's results, reviewed by myself:  Labs:  Results for orders placed during the hospital encounter of 04/03/14 (from the past 24 hour(s))  BASIC METABOLIC PANEL     Status: Abnormal   Collection Time    04/03/14  3:55 AM      Result Value Ref Range   Sodium 141  137 - 147 mEq/L   Potassium 3.9  3.7 - 5.3 mEq/L   Chloride 102  96 - 112 mEq/L   CO2 27  19 - 32 mEq/L   Glucose, Bld 101  (*) 70 - 99 mg/dL   BUN 14  6 - 23 mg/dL   Creatinine, Ser 4.090.80  0.50 - 1.10 mg/dL   Calcium 81.110.5  8.4 - 91.410.5 mg/dL   GFR calc non Af Amer >90  >90 mL/min   GFR calc Af Amer >90  >90 mL/min  CBC WITH DIFFERENTIAL     Status: Abnormal   Collection Time    04/03/14  3:55 AM      Result Value Ref Range   WBC 10.5  4.0 - 10.5 K/uL   RBC 4.06  3.87 - 5.11 MIL/uL   Hemoglobin 12.9  12.0 - 15.0 g/dL   HCT 78.237.5  95.636.0 - 21.346.0 %   MCV 92.4  78.0 - 100.0 fL   MCH 31.8  26.0 - 34.0 pg   MCHC 34.4  30.0 - 36.0 g/dL   RDW 08.611.6  57.811.5 - 46.915.5 %   Platelets 268  150 - 400 K/uL   Neutrophils Relative % 75  43 - 77 %   Neutro Abs 7.9 (*) 1.7 - 7.7 K/uL   Lymphocytes Relative 19  12 - 46 %   Lymphs Abs 2.0  0.7 - 4.0 K/uL   Monocytes Relative 5  3 - 12 %   Monocytes Absolute 0.6  0.1 - 1.0 K/uL   Eosinophils Relative 0  0 - 5 %   Eosinophils Absolute 0.0  0.0 - 0.7 K/uL   Basophils Relative 0  0 - 1 %   Basophils Absolute 0.0  0.0 - 0.1 K/uL  TROPONIN I     Status: None   Collection Time    04/03/14  3:55 AM      Result Value Ref Range   Troponin I <0.30  <0.30 ng/mL   4:27 AM Suspect patient has had panic attacks. There is no evidence of acute cardiac disease. She was advised of slight abnormalities in her EKG and she will follow up with her primary care physician [facility].     Hanley SeamenJohn L Brandan Robicheaux, MD 04/03/14 [id number]  Hanley SeamenJohn L Elisah Parmer, MD 04/03/14 [id number]

## 2014-04-03 NOTE — ED Notes (Signed)
C/o chills and feeling nervous about her health. Reports one episode of sudden left sharp chest pain on Monday morning, which subsided spontaneously, and heart palpation intermittently since Monday. Pt alert, oriented. VSS, HR regular. Will obtain EKG

## 2014-04-03 NOTE — ED Notes (Signed)
Pt c/o rapid sharp stabbing pain  Then chills nervous and rapid breathing lasting less than 3 minutes, on Monday am and then last pm,  States has been having heart burn and increased burping,  Denies n/v,  No sx at present

## 2014-04-03 NOTE — Discharge Instructions (Signed)
Panic Attacks  Panic attacks are sudden, short-lived surges of severe anxiety, fear, or discomfort. They may occur for no reason when you are relaxed, when you are anxious, or when you are sleeping. Panic attacks may occur for a number of reasons:   · Healthy people occasionally have panic attacks in extreme, life-threatening situations, such as war or natural disasters. Normal anxiety is a protective mechanism of the body that helps us react to danger (fight or flight response).  · Panic attacks are often seen with anxiety disorders, such as panic disorder, social anxiety disorder, generalized anxiety disorder, and phobias. Anxiety disorders cause excessive or uncontrollable anxiety. They may interfere with your relationships or other life activities.  · Panic attacks are sometimes seen with other mental illnesses such as depression and posttraumatic stress disorder.  · Certain medical conditions, prescription medicines, and drugs of abuse can cause panic attacks.  SYMPTOMS   Panic attacks start suddenly, peak within 20 minutes, and are accompanied by four or more of the following symptoms:  · Pounding heart or fast heart rate (palpitations).  · Sweating.  · Trembling or shaking.  · Shortness of breath or feeling smothered.  · Feeling choked.  · Chest pain or discomfort.  · Nausea or strange feeling in your stomach.  · Dizziness, lightheadedness, or feeling like you will faint.  · Chills or hot flushes.  · Numbness or tingling in your lips or hands and feet.  · Feeling that things are not real or feeling that you are not yourself.  · Fear of losing control or going crazy.  · Fear of dying.  Some of these symptoms can mimic serious medical conditions. For example, you may think you are having a heart attack. Although panic attacks can be very scary, they are not life threatening.  DIAGNOSIS   Panic attacks are diagnosed through an assessment by your health care provider. Your health care provider will ask questions  about your symptoms, such as where and when they occurred. Your health care provider will also ask about your medical history and use of alcohol and drugs, including prescription medicines. Your health care provider may order blood tests or other studies to rule out a serious medical condition. Your health care provider may refer you to a mental health professional for further evaluation.  TREATMENT   · Most healthy people who have one or two panic attacks in an extreme, life-threatening situation will not require treatment.  · The treatment for panic attacks associated with anxiety disorders or other mental illness typically involves counseling with a mental health professional, medicine, or a combination of both. Your health care provider will help determine what treatment is best for you.  · Panic attacks due to physical illness usually goes away with treatment of the illness. If prescription medicine is causing panic attacks, talk with your health care provider about stopping the medicine, decreasing the dose, or substituting another medicine.  · Panic attacks due to alcohol or drug abuse goes away with abstinence. Some adults need professional help in order to stop drinking or using drugs.  HOME CARE INSTRUCTIONS   · Take all your medicines as prescribed.    · Check with your health care provider before starting new prescription or over-the-counter medicines.  · Keep all follow up appointments with your health care provider.  SEEK MEDICAL CARE IF:  · You are not able to take your medicines as prescribed.  · Your symptoms do not improve or get worse.  SEEK IMMEDIATE   MEDICAL CARE IF:   · You experience panic attack symptoms that are different than your usual symptoms.  · You have serious thoughts about hurting yourself or others.  · You are taking medicine for panic attacks and have a serious side effect.  MAKE SURE YOU:  · Understand these instructions.  · Will watch your condition.  · Will get help right away  if you are not doing well or get worse.  Document Released: 11/06/2005 Document Revised: 08/27/2013 Document Reviewed: 06/20/2013  ExitCare® Patient Information ©2014 [employer].

## 2014-04-20 ENCOUNTER — Encounter: Payer: Self-pay | Admitting: Cardiology

## 2014-04-20 ENCOUNTER — Ambulatory Visit (INDEPENDENT_AMBULATORY_CARE_PROVIDER_SITE_OTHER): Payer: 59 | Admitting: Cardiology

## 2014-04-20 VITALS — BP 118/71 | HR 83 | Ht 60.0 in | Wt 108.0 lb

## 2014-04-20 DIAGNOSIS — R0602 Shortness of breath: Secondary | ICD-10-CM

## 2014-04-20 DIAGNOSIS — R9431 Abnormal electrocardiogram [ECG] [EKG]: Secondary | ICD-10-CM | POA: Insufficient documentation

## 2014-04-20 DIAGNOSIS — R002 Palpitations: Secondary | ICD-10-CM

## 2014-04-20 NOTE — Progress Notes (Signed)
  [address] [place], [place]  [address] Phone: [phone] Fax:  [phone]  Date:  04/20/2014   ID:  Kimberly Chambers, DOB 05-04-1986, MRN [id number]  PCP:  Tarri Fuller, MD  Cardiologist:  Armanda Magic, MD     History of Present Illness: Kimberly Chambers is a 28 y.o. female with no prior cardiac history who presents today for evaluation of chest pain.  She was recently seen in the ER after awakening with a sharp localized moderate to severe pain to the left of her sternum that lasted a few seconds and then was followed by a generalized shaking with rapid breathing, palpitations and anxiety. It resolved after a few minutes.  She had a second episode with the same symptoms and lasted about 10 minutes and resolved.  She has no chest pain with that episode.    This occurred after she got home from work.  The third episode happened while lying on the couch and then developed a rapid heart beat and was SOB. She says that the rapid heart beat comes first and then she starts shaking and gets SOB.  She denies any dizziness or syncope.  EKG in ER showed sinus tachycardia at 107bpm with inverted T wave in V4 and LAE.  She drinks 1 cup of coffee in the am and has no soda or tea.  She denies any LE edema and does not smoke.     Wt Readings from Last 3 Encounters:  04/20/14 108 lb (48.988 kg)  04/08/12 118 lb (53.524 kg)  03/27/12 117 lb (53.071 kg)     Past Medical History  Diagnosis Date  . H/O candidiasis   . H/O varicella     No current outpatient prescriptions on file.   No current facility-administered medications for this visit.    Allergies:    Allergies  Allergen Reactions  . Amoxicillin-Pot Clavulanate     Childhood reaction- hives, swelling  . Ceclor [Cefaclor]     Childhood reaction- hives, slight throat swelling  . Cholestatin     Social History:  The patient  reports that she has never smoked. She has never used smokeless tobacco. She reports that she does not  drink alcohol or use illicit drugs.   Family History:  The patient's family history includes Cancer in her maternal aunt and paternal grandmother; Hypertension in her maternal grandfather and maternal grandmother; Stroke in her maternal grandmother.   ROS:  Please see the history of present illness.      All other systems reviewed and negative.   PHYSICAL EXAM: VS:  BP 118/71  Pulse 83  Ht 5' (1.524 m)  Wt 108 lb (48.988 kg)  BMI 21.09 kg/m2 Well nourished, well developed, in no acute distress HEENT: normal Neck: no JVD Cardiac:  normal S1, S2; RRR; no murmur Lungs:  clear to auscultation bilaterally, no wheezing, rhonchi or rales Abd: soft, nontender, no hepatomegaly Ext: no edema Skin: warm and dry Neuro:  CNs 2-12 intact, no focal abnormalities noted  EKG:    NSR with nonspecific ST abnormality   ASSESSMENT AND PLAN:  1. Palpitations  - event monitor to assess for arrhythmias 2. SOB most likely secondary to #1Follo - 2D echo to assess LVF 3.   Abnormal EKG with nonspecific ST abnormality on EKG - ETT to rule out ischemia  Followup with me PRN pending results of studies  Signed, Armanda Magic, MD 04/20/2014 11:22 AM

## 2014-04-20 NOTE — Patient Instructions (Signed)
Your physician has requested that you have an exercise tolerance test. For further information please visit [email or web address] Please also follow instruction sheet, as given.  Your physician has requested that you have an echocardiogram. Echocardiography is a painless test that uses sound waves to create images of your heart. It provides your doctor with information about the size and shape of your heart and how well your heart's chambers and valves are working. This procedure takes approximately one hour. There are no restrictions for this procedure.  Your physician has recommended that you wear an event monitor. Event monitors are medical devices that record the heart's electrical activity. Doctors most often [place] these monitors to diagnose arrhythmias. Arrhythmias are problems with the speed or rhythm of the heartbeat. The monitor is a small, portable device. You can wear one while you do your normal daily activities. This is usually used to diagnose what is causing palpitations/syncope (passing out).  Your physician recommends that you schedule a follow-up appointment AS NEEDED WITH DR TURNER.

## 2014-04-24 ENCOUNTER — Encounter: Payer: Self-pay | Admitting: Radiology

## 2014-04-24 ENCOUNTER — Encounter (INDEPENDENT_AMBULATORY_CARE_PROVIDER_SITE_OTHER): Payer: 59

## 2014-04-24 DIAGNOSIS — R9431 Abnormal electrocardiogram [ECG] [EKG]: Secondary | ICD-10-CM

## 2014-04-24 DIAGNOSIS — R002 Palpitations: Secondary | ICD-10-CM

## 2014-04-24 DIAGNOSIS — R0602 Shortness of breath: Secondary | ICD-10-CM

## 2014-04-24 NOTE — Progress Notes (Signed)
Patient ID: Kimberly Chambers, female   DOB: Nov 24, 1985, 28 y.o.   MRN: [id number] lifewatch 30 day monitor applied

## 2014-05-01 ENCOUNTER — Telehealth (HOSPITAL_COMMUNITY): Payer: Self-pay

## 2014-05-06 ENCOUNTER — Ambulatory Visit (HOSPITAL_BASED_OUTPATIENT_CLINIC_OR_DEPARTMENT_OTHER)
Admission: RE | Admit: 2014-05-06 | Discharge: 2014-05-06 | Disposition: A | Discharge status: Home / Self Care | Payer: 59 | Source: Ambulatory Visit | Attending: Cardiology | Admitting: Cardiology

## 2014-05-06 ENCOUNTER — Ambulatory Visit (HOSPITAL_COMMUNITY)
Admission: RE | Admit: 2014-05-06 | Discharge: 2014-05-06 | Disposition: A | Discharge status: Home / Self Care | Payer: 59 | Source: Ambulatory Visit | Attending: Cardiology | Admitting: Cardiology

## 2014-05-06 DIAGNOSIS — R9431 Abnormal electrocardiogram [ECG] [EKG]: Secondary | ICD-10-CM

## 2014-05-06 DIAGNOSIS — R0602 Shortness of breath: Secondary | ICD-10-CM

## 2014-05-06 DIAGNOSIS — R002 Palpitations: Secondary | ICD-10-CM

## 2014-05-06 NOTE — Procedures (Signed)
Exercise Treadmill Test  Pre-Exercise Testing Evaluation Rhythm: normal sinus  Rate: 96   ST Segments:  no significant ST changes at rest     Test  Exercise Tolerance Test Ordering MD: Little Ishikawaraci Turner  David W. Harding, MD  Unique Test No: 1  Treadmill:  1  Indication for ETT: chest pain - rule out ischemia  Contraindication to ETT: No   Stress Modality: exercise - treadmill  Cardiac Imaging Performed: non   Protocol: standard Bruce - maximal  Max BP:  169/56  Max MPHR (bpm):  190 85% MPR (bpm):  161  MPHR obtained (bpm):  184 % MPHR obtained:  96%  Reached 85% MPHR (min:sec):  9:18 Total Exercise Time (min-sec):  12   Workload in METS:  13.4 Borg Scale: 14  Reason ETT Terminated:  Leg Discomfort RPP 31096   ST Segment Analysis At Rest: normal ST segments - no evidence of significant ST depression With Exercise: no evidence of significant ST depression ; mild upsloping ST-T waves  Other Information Arrhythmia:  No Angina during ETT:  absent (0) Quality of ETT:  diagnostic  ETT Interpretation:  normal - no evidence of ischemia by ST analysis  Comments: Excellent Exercise Tolerance for Age  Normal BR response to Exercise.  Duke TM Score: 12; LOW RISK  Recommendations: Additional Evaluation with Imaging not necessary.    Marykay LexHARDING,DAVID W, M.D., M.S. Interventional Cardiologist   Pager # [phone] 05/06/2014

## 2014-05-06 NOTE — Progress Notes (Signed)
2D Echo Performed 05/06/2014    Tammie Crouch, RCS  

## 2014-05-20 NOTE — Telephone Encounter (Signed)
Encounter complete. 

## 2014-05-27 ENCOUNTER — Telehealth: Payer: Self-pay | Admitting: Cardiology

## 2014-05-27 NOTE — Telephone Encounter (Signed)
Please let patient know that heart monitor showed NSR to sinus tachcyardia with HR from 47bpm to 159bpm but no arrhythmias to account for palpitations.  Please find out if patient was working out on 6/10 at 4pm, 6/11 at 9:15am when HR was 159bpm

## 2014-05-28 NOTE — Telephone Encounter (Signed)
lmtrc

## 2014-05-28 NOTE — Telephone Encounter (Signed)
Pt is aware of the results and also she stated yes she was working out most likely at those times and she does work out routinely.

## 2014-09-21 ENCOUNTER — Encounter: Payer: Self-pay | Admitting: Cardiology

## 2014-12-23 ENCOUNTER — Encounter: Payer: Self-pay | Admitting: Family Medicine

## 2014-12-23 ENCOUNTER — Ambulatory Visit (INDEPENDENT_AMBULATORY_CARE_PROVIDER_SITE_OTHER): Payer: 59 | Admitting: Family Medicine

## 2014-12-23 ENCOUNTER — Other Ambulatory Visit (INDEPENDENT_AMBULATORY_CARE_PROVIDER_SITE_OTHER): Payer: 59

## 2014-12-23 VITALS — BP 112/66 | HR 86 | Ht 60.0 in | Wt 113.0 lb

## 2014-12-23 DIAGNOSIS — M25561 Pain in right knee: Secondary | ICD-10-CM

## 2014-12-23 DIAGNOSIS — M222X9 Patellofemoral disorders, unspecified knee: Secondary | ICD-10-CM

## 2014-12-23 DIAGNOSIS — M25562 Pain in left knee: Secondary | ICD-10-CM

## 2014-12-23 NOTE — Progress Notes (Signed)
[facility] [address] [place], [place] [address] Phone: [phone] Subjective:    CC: Bilateral knee pain  BJY:[id number] Kimberly Chambers is a 29 y.o. female coming in with complaint of bilateral knee pain. Patient states that this came on over the course of several weeks. Patient is an avid runner averaging approximately 20-25 miles a week. Patient has noticed though that unfortunately after running she does have some pain on the anterior aspect of her knees. States that it sometimes feels a rubbing sensation. Can hurt more after sitting a long amount of time in trying to stand. Patient does not remember any true injury. Patient denies any radiation down the leg or any numbness or tingling. Patient rates the severity of pain as 5 out of 10. Has not responded to home modalities at this time.     Past medical history, social, surgical and family history all reviewed in electronic medical record.   Review of Systems: No headache, visual changes, nausea, vomiting, diarrhea, constipation, dizziness, abdominal pain, skin rash, fevers, chills, night sweats, weight loss, swollen lymph nodes, body aches, joint swelling, muscle aches, chest pain, shortness of breath, mood changes.   Objective Blood pressure 112/66, pulse 86, height 5' (1.524 m), weight 113 lb (51.256 kg), SpO2 99 %, currently breastfeeding.  General: No apparent distress alert and oriented x3 mood and affect normal, dressed appropriately.  HEENT: Pupils equal, extraocular movements intact  Respiratory: Patient's speak in full sentences and does not appear short of breath  Cardiovascular: No lower extremity edema, non tender, no erythema  Skin: Warm dry intact with no signs of infection or rash on extremities or on axial skeleton.  Abdomen: Soft nontender  Neuro: Cranial nerves II through XII are intact, neurovascularly intact in all extremities with 2+ DTRs and 2+ pulses.  Lymph: No lymphadenopathy of  posterior or anterior cervical chain or axillae bilaterally.  Gait normal with good balance and coordination.  MSK:  Non tender with full range of motion and good stability and symmetric strength and tone of shoulders, elbows, wrist, hip, and ankles bilaterally. Patient does have hypermobility of multiple joints. Beighton score of 7 Knee: Bilateral Normal to inspection with no erythema or effusion or obvious bony abnormalities. Patient has mild palpation tenderness over the lateral superior aspect of the patella bilaterally right greater than left Mild atrophy of the VMO bilaterally record of left ROM full in flexion and extension and lower leg rotation. Ligaments with solid consistent endpoints including ACL, PCL, LCL, MCL. Negative Mcmurray's, Apley's, and Thessalonian tests. Non painful patellar compression. Patient does have J tracking Patellar glide mild crepitus. Patellar and quadriceps tendons unremarkable. Hamstring and quadriceps strength is normal.   MSK US performed of: Bilateral knee This study was ordered, performed, and interpreted by Terrilee Files D.O.  Knee: All structures visualized. Anteromedial, anterolateral, posteromedial, and posterolateral menisci unremarkable without tearing, fraying, effusion, or displacement. No signs of osteophyte arthritis, mild narrowing of the patellofemoral joint bilaterally on the lateral aspect. Patellar Tendon unremarkable on long and transverse views without effusion. No abnormality of prepatellar bursa. LCL and MCL unremarkable on long and transverse views. No abnormality of origin of medial or lateral head of the gastrocnemius.  IMPRESSION: Patellofemoral syndrome   Procedure note 97110; 15 minutes spent for Therapeutic exercises as stated in above notes.  This included exercises focusing on stretching, strengthening, with significant focus on eccentric aspects.   Proper technique shown and discussed handout in great detail with ATC.  All questions were discussed and answered.        Impression and Recommendations:     This case required medical decision making of moderate complexity.

## 2014-12-23 NOTE — Assessment & Plan Note (Signed)
Patellofemoral Syndrome  Reviewed anatomy using anatomical model and how PFS occurs.  Given rehab exercises handout for VMO, hip abductors, core, entire kinetic chain including proprioception exercises including cone touches, step downs, hip elevations and turn outs.  Could benefit from PT, regular exercise, upright biking, and a PFS knee brace to assist with tracking abnormalities. Patient did work with the Event organiserathletic trainer today living the home exercises in greater detail. Patient was given a brace to avoid the lateral tracking. Patient will try to make these different changes and come back and see me again in 3 weeks. If continuing have difficulty we may need to consider further imaging as well as potential injections.

## 2014-12-23 NOTE — Patient Instructions (Signed)
Good to see you Ice 20 minutes 2 times daily. Usually after activity and before bed. No Yoga Turmeric 500mg  twice daily.  Exercises 3 times a week.  Wear brace with running. Consider compression sleeve on other side, look at [email or web address] Look up Newtons for shoe.  See me again in 3 weeks.

## 2014-12-23 NOTE — Progress Notes (Signed)
Pre visit review using our clinic review tool, if applicable. No additional management support is needed unless otherwise documented below in the visit note. 

## 2015-01-12 ENCOUNTER — Encounter: Payer: Self-pay | Admitting: Family Medicine

## 2015-01-18 ENCOUNTER — Ambulatory Visit (INDEPENDENT_AMBULATORY_CARE_PROVIDER_SITE_OTHER): Payer: 59 | Admitting: Family Medicine

## 2015-01-18 ENCOUNTER — Encounter: Payer: Self-pay | Admitting: Family Medicine

## 2015-01-18 VITALS — BP 120/70 | HR 85 | Ht 60.0 in | Wt 116.0 lb

## 2015-01-18 DIAGNOSIS — M222X9 Patellofemoral disorders, unspecified knee: Secondary | ICD-10-CM

## 2015-01-18 NOTE — Progress Notes (Signed)
Pre visit review using our clinic review tool, if applicable. No additional management support is needed unless otherwise documented below in the visit note. 

## 2015-01-18 NOTE — Progress Notes (Signed)
  Tawana ScaleZach Smith D.O. [facility] [address] [place], [place] [address] Phone: [phone] Subjective:    CC: Bilateral knee pain  ONG:EXBMWUXLKGHPI:Subjective Kimberly GantKatie S Chambers is a 29 y.o. female coming in with complaint of bilateral knee pain. This was found to have patellofemoral syndrome. Patient was given home exercises, bracing, icing regimen. Patient states that she is feeling significantly better. Patient states that her right leg is now her stronger leg. Patient states still some mild discomfort on the left knee but nothing that is stopping her from activities. Patient rates the severity of pain as 3 out of 10.     Past medical history, social, surgical and family history all reviewed in electronic medical record.   Review of Systems: No headache, visual changes, nausea, vomiting, diarrhea, constipation, dizziness, abdominal pain, skin rash, fevers, chills, night sweats, weight loss, swollen lymph nodes, body aches, joint swelling, muscle aches, chest pain, shortness of breath, mood changes.   Objective Blood pressure 120/70, pulse 85, height 5' (1.524 m), weight 116 lb (52.617 kg), SpO2 99 %, currently breastfeeding.  General: No apparent distress alert and oriented x3 mood and affect normal, dressed appropriately.  HEENT: Pupils equal, extraocular movements intact  Respiratory: Patient's speak in full sentences and does not appear short of breath  Cardiovascular: No lower extremity edema, non tender, no erythema  Skin: Warm dry intact with no signs of infection or rash on extremities or on axial skeleton.  Abdomen: Soft nontender  Neuro: Cranial nerves II through XII are intact, neurovascularly intact in all extremities with 2+ DTRs and 2+ pulses.  Lymph: No lymphadenopathy of posterior or anterior cervical chain or axillae bilaterally.  Gait normal with good balance and coordination.  MSK:  Non tender with full range of motion and good stability and symmetric strength and tone of  shoulders, elbows, wrist, hip, and ankles bilaterally. Patient does have hypermobility of multiple joints. Beighton score of 7 Knee: Bilateral Normal to inspection with no erythema or effusion or obvious bony abnormalities. Patient has mild palpation tenderness over the lateral superior aspect of the patella bilaterally but improved Mild atrophy of the VMO bilaterally record of left still present but improved. Patient has significant strengthening of the right side. ROM full in flexion and extension and lower leg rotation. Ligaments with solid consistent endpoints including ACL, PCL, LCL, MCL. Negative Mcmurray's, Apley's, and Thessalonian tests. Non painful patellar compression. Patient does have J tracking Patellar glide mild crepitus. Patellar and quadriceps tendons unremarkable. Hamstring and quadriceps strength is normal.          Impression and Recommendations:     This case required medical decision making of moderate complexity.

## 2015-01-18 NOTE — Assessment & Plan Note (Signed)
She has made significant strides at this time. We discussed icing and home exercises. We discussed continuing the other exercises but more strengthening now. We discussed still avoiding significant stretching secondary to patient's hypermobility syndrome. We also discussed with patient about bracing of the left knee and patient was given a new brace today. Patient will try to make these different changes and as long she continues to do well we'll follow-up in 6 weeks for further evaluation and treatment.

## 2015-01-18 NOTE — Patient Instructions (Signed)
Good to see you Try sewing the brace but if it gives you trouble we will get you a new one.  Continue the exercises 3 times a week for another 6 weeks You have done great Ice still at night at least See me again in 6 weeks if not perfect

## 2015-03-04 ENCOUNTER — Encounter: Payer: Self-pay | Admitting: Family Medicine

## 2015-03-05 ENCOUNTER — Ambulatory Visit (INDEPENDENT_AMBULATORY_CARE_PROVIDER_SITE_OTHER): Payer: 59 | Admitting: Family Medicine

## 2015-03-05 ENCOUNTER — Ambulatory Visit (INDEPENDENT_AMBULATORY_CARE_PROVIDER_SITE_OTHER)
Admission: RE | Admit: 2015-03-05 | Discharge: 2015-03-05 | Disposition: A | Discharge status: Home / Self Care | Payer: 59 | Source: Ambulatory Visit | Attending: Family Medicine | Admitting: Family Medicine

## 2015-03-05 ENCOUNTER — Ambulatory Visit: Payer: Self-pay | Admitting: Family Medicine

## 2015-03-05 ENCOUNTER — Encounter: Payer: Self-pay | Admitting: Family Medicine

## 2015-03-05 VITALS — BP 102/68 | HR 64 | Ht 60.0 in | Wt 114.0 lb

## 2015-03-05 DIAGNOSIS — M25562 Pain in left knee: Secondary | ICD-10-CM

## 2015-03-05 DIAGNOSIS — M222X9 Patellofemoral disorders, unspecified knee: Secondary | ICD-10-CM | POA: Diagnosis not present

## 2015-03-05 NOTE — Patient Instructions (Addendum)
Good to see you Still ice after working out and before bed for about 10-20 minutes.  Try pennsaid topically 2 times daily for 3 days Continue the exercises Exercise 3 times a week for 2 weeks then increase 1 day every 2 weeks.  See me again if swelling is worse or pain is worse.

## 2015-03-05 NOTE — Assessment & Plan Note (Signed)
I believe patient understands an exacerbation and does have a small effusion. Patient has x-rays pending. We discussed icing regimen as well as continuing bracing with certain activities. Patient will do topical anti-inflammatories and will see me again in perfect. This the possibility of needing advanced imaging to rule out any type of significant intra-articular pathology that is not showing up on x-ray or ultrasound. He also discussed the possibility of injection. Patient will come back in 3 weeks.

## 2015-03-05 NOTE — Progress Notes (Signed)
Pre visit review using our clinic review tool, if applicable. No additional management support is needed unless otherwise documented below in the visit note. 

## 2015-03-05 NOTE — Progress Notes (Signed)
  Tawana ScaleZach Kimberley Speece D.O.  [facility] [address] [place], [place] [address] Phone: [phone] Subjective:    CC: Bilateral knee pain  BJY:NWGNFAOZHYHPI:Subjective Kimberly Chambers is a 29 y.o. female coming in with complaint of bilateral knee pain. This was found to have patellofemoral syndrome. Patient was given home exercises, bracing, icing regimen. Patient states that she is feeling significantly better. Patient was doing significantly better but is continued have pain on the left knee. Patient states she was doing well until recently. Patient states that she was doing a Psychologist, educationalZumba marathon and a 5K and a weekend. Patient had some swelling and pain of the left knee. Patient denies any locking or giving out on her and over the course last 2 days with her decreasing the amount of activity she is doing and has improved significantly. Patient still states that the pain is very similar to her previous presentation.    Past medical history, social, surgical and family history all reviewed in electronic medical record.   Review of Systems: No headache, visual changes, nausea, vomiting, diarrhea, constipation, dizziness, abdominal pain, skin rash, fevers, chills, night sweats, weight loss, swollen lymph nodes, body aches, joint swelling, muscle aches, chest pain, shortness of breath, mood changes.   Objective Blood pressure 102/68, pulse 64, height 5' (1.524 m), weight 114 lb (51.71 kg), last menstrual period 02/18/2015, SpO2 98 %, currently breastfeeding.  General: No apparent distress alert and oriented x3 mood and affect normal, dressed appropriately.  HEENT: Pupils equal, extraocular movements intact  Respiratory: Patient's speak in full sentences and does not appear short of breath  Cardiovascular: No lower extremity edema, non tender, no erythema  Skin: Warm dry intact with no signs of infection or rash on extremities or on axial skeleton.  Abdomen: Soft nontender  Neuro: Cranial nerves II through XII are  intact, neurovascularly intact in all extremities with 2+ DTRs and 2+ pulses.  Lymph: No lymphadenopathy of posterior or anterior cervical chain or axillae bilaterally.  Gait normal with good balance and coordination.  MSK:  Non tender with full range of motion and good stability and symmetric strength and tone of shoulders, elbows, wrist, hip, and ankles bilaterally. Patient does have hypermobility of multiple joints. Beighton score of 7 Knee: Left Normal to inspection with no erythema or effusion or obvious bony abnormalities. Patient has mild palpation tenderness over the lateral superior aspect of the patella bilaterally  ROM full in flexion and extension and lower leg rotation. Ligaments with solid consistent endpoints including ACL, PCL, LCL, MCL. Negative Mcmurray's, Apley's, and Thessalonian tests. Non painful patellar compression. Patient does have J tracking Patellar glide mild crepitus. Patellar and quadriceps tendons unremarkable. Hamstring and quadriceps strength is normal.  Contralateral knee unremarkable  MSK US performed of: Left knee This study was ordered, performed, and interpreted by Terrilee FilesZach Monserath Neff D.O.  Knee: All structures visualized. Small effusion noted Anteromedial, anterolateral, posteromedial, and posterolateral menisci unremarkable without tearing, fraying, effusion, or displacement. Patellar Tendon unremarkable on long and transverse views without effusion. No abnormality of prepatellar bursa. LCL and MCL unremarkable on long and transverse views. No abnormality of origin of medial or lateral head of the gastrocnemius.  IMPRESSION:  Mild nonspecific effusion     Impression and Recommendations:     This case required medical decision making of moderate complexity.

## 2015-04-16 ENCOUNTER — Other Ambulatory Visit (HOSPITAL_COMMUNITY): Payer: Self-pay | Admitting: Otolaryngology

## 2015-05-05 ENCOUNTER — Encounter (HOSPITAL_COMMUNITY)
Admission: RE | Admit: 2015-05-05 | Discharge: 2015-05-05 | Disposition: A | Discharge status: Home / Self Care | Payer: 59 | Source: Ambulatory Visit | Attending: Otolaryngology | Admitting: Otolaryngology

## 2015-05-05 ENCOUNTER — Encounter (HOSPITAL_COMMUNITY): Payer: Self-pay

## 2015-05-05 DIAGNOSIS — J0391 Acute recurrent tonsillitis, unspecified: Secondary | ICD-10-CM | POA: Diagnosis not present

## 2015-05-05 DIAGNOSIS — Z01818 Encounter for other preprocedural examination: Secondary | ICD-10-CM | POA: Diagnosis present

## 2015-05-05 HISTORY — DX: Other specified postprocedural states: R11.2

## 2015-05-05 HISTORY — DX: Acute pharyngitis, unspecified: J02.9

## 2015-05-05 HISTORY — DX: Anxiety disorder, unspecified: F41.9

## 2015-05-05 HISTORY — DX: Other specified postprocedural states: Z98.890

## 2015-05-05 LAB — CBC
HCT: 37.6 % (ref 36.0–46.0)
HEMOGLOBIN: 12.9 g/dL (ref 12.0–15.0)
MCH: 31.8 pg (ref 26.0–34.0)
MCHC: 34.3 g/dL (ref 30.0–36.0)
MCV: 92.6 fL (ref 78.0–100.0)
Platelets: 266 10*3/uL (ref 150–400)
RBC: 4.06 MIL/uL (ref 3.87–5.11)
RDW: 12.7 % (ref 11.5–15.5)
WBC: 6.2 10*3/uL (ref 4.0–10.5)

## 2015-05-05 LAB — HCG, SERUM, QUALITATIVE: PREG SERUM: NEGATIVE

## 2015-05-05 NOTE — Pre-Procedure Instructions (Signed)
Kimberly Chambers  05/05/2015      [id number] - [place], [place] - [address] [place] [address] Phone: [phone] Fax: [phone]    Your procedure is scheduled on Wed, June 22 @ 2:30 PM  Report to [facility] Admitting at 12:30 PM  Call this number if you have problems the morning of surgery:  [phone]   Remember:  Do not eat food or drink liquids after midnight.                No Goody's,BC's,Aleve,Aspirin,Ibuprofen,Fish Oil,or any Herbal Medications.    Do not wear jewelry, make-up or nail polish.  Do not wear lotions, powders, or perfumes.  You may wear deodorant.  Do not shave 48 hours prior to surgery.    Do not bring valuables to the hospital.  [facility] is not responsible for any belongings or valuables.  Contacts, dentures or bridgework may not be worn into surgery.  Leave your suitcase in the car.  After surgery it may be brought to your room.  For patients admitted to the hospital, discharge time will be determined by your treatment team.  Patients discharged the day of surgery will not be allowed to drive home.    Special instructions:  [facility] - Preparing for Surgery  Before surgery, you can play an important role.  Because skin is not sterile, your skin needs to be as free of germs as possible.  You can reduce the number of germs on you skin by washing with CHG (chlorahexidine gluconate) soap before surgery.  CHG is an antiseptic cleaner which kills germs and bonds with the skin to continue killing germs even after washing.  Please DO NOT use if you have an allergy to CHG or antibacterial soaps.  If your skin becomes reddened/irritated stop using the CHG and inform your nurse when you arrive at Short Stay.  Do not shave (including legs and underarms) for at least 48 hours prior to the first CHG shower.  You may shave your face.  Please follow these instructions carefully:   1.  Shower with CHG Soap the  night before surgery and the                                morning of Surgery.  2.  If you choose to wash your hair, wash your hair first as usual with your       normal shampoo.  3.  After you shampoo, rinse your hair and body thoroughly to remove the                      Shampoo.  4.  Use CHG as you would any other liquid soap.  You can apply chg directly       to the skin and wash gently with scrungie or a clean washcloth.  5.  Apply the CHG Soap to your body ONLY FROM THE NECK DOWN.        Do not use on open wounds or open sores.  Avoid contact with your eyes,       ears, mouth and genitals (private parts).  Wash genitals (private parts)       with your normal soap.  6.  Wash thoroughly, paying special attention to the area where your surgery        will be performed.  7.  Thoroughly rinse your body with warm water from the neck down.  8.  DO NOT shower/wash with your normal soap after using and rinsing off       the CHG Soap.  9.  Pat yourself dry with a clean towel.            10.  Wear clean pajamas.            11.  Place clean sheets on your bed the night of your first shower and do not        sleep with pets.  Day of Surgery  Do not apply any lotions/deoderants the morning of surgery.  Please wear clean clothes to the hospital/surgery center.    Please read over the following fact sheets that you were given. Pain Booklet, Coughing and Deep Breathing and Surgical Site Infection Prevention

## 2015-05-09 NOTE — H&P (Addendum)
05/12/2015  2:28 PM   Kimberly Chambers  PREOPERATIVE HISTORY AND PHYSICAL  CHIEF COMPLAINT: tonsillar hypertrophy, recurrent tonsillitis  HISTORY: This is a 29 year old who presents with tonsillar hypertrophy, recurrent tonsillitis.  She now presents for tonsillectomy.  Dr. Emeline Darling, [place] has discussed the risks (bleeding, infection, anesthesia risks), benefits, and alternatives of this procedure. The patient understands the risks and would like to proceed with the procedure. The chances of success of the procedure are >50% and the patient understands this. I personally performed an examination of the patient within 24 hours of the procedure.  PAST MEDICAL HISTORY: Past Medical History  Diagnosis Date  . H/O candidiasis   . H/O varicella   . PONV (postoperative nausea and vomiting)   . Sore throat   . Anxiety      PAST SURGICAL HISTORY: Past Surgical History  Procedure Laterality Date  . Wisdom tooth extraction      x4, 2002  . Tympanostomy tube placement      as child   . Lingual frenectomy      MEDICATIONS: No current facility-administered medications on file prior to encounter.   No current outpatient prescriptions on file prior to encounter.     ALLERGIES: Allergies  Allergen Reactions  . Amoxicillin-Pot Clavulanate Hives and Swelling    Childhood reaction-  . Ceclor [Cefaclor] Hives and Swelling    Childhood reaction- hives, slight throat swelling      SOCIAL HISTORY: History   Social History  . Marital Status: Married    Spouse Name: N/A  . Number of Children: N/A  . Years of Education: N/A   Occupational History  . Not on file.   Social History Main Topics  . Smoking status: Never Smoker   . Smokeless tobacco: Never Used  . Alcohol Use: Yes     Comment: socially  . Drug Use: No  . Sexual Activity:    Partners: Male    Birth Control/ Protection: Abstinence   Other Topics Concern  . Not on file   Social History Narrative    FAMILY HISTORY:   Family History  Problem Relation Age of Onset  . Cancer Maternal Aunt   . Hypertension Maternal Grandmother   . Stroke Maternal Grandmother   . Hypertension Maternal Grandfather   . Cancer Paternal Grandmother     REVIEW OF SYSTEMS:  HEENT: ROS negative x 12 systems except per HPI  PHYSICAL EXAM:  GENERAL:  NAD VITAL SIGNS: Filed Vitals:   05/12/15 1254  BP: 119/64  Pulse: 74  Temp: 98.8 F (37.1 C)  Resp: 18   SKIN:  Warm, dry HEENT:  Hypertrophic tonsils  ABDOMEN:  soft MUSCULOSKELETAL: normal strength PSYCH:  Normal affect NEUROLOGIC:  CN 2-12 intact and symmetric   ASSESSMENT AND PLAN: Plan to proceed with tonsillectomy. Patient understands the risks, benefits, and alternatives. Informed written consent signed witnessed and on chart/ 2:28 PM  05/12/2015  Kimberly Chambers

## 2015-05-12 ENCOUNTER — Encounter (HOSPITAL_COMMUNITY): Payer: Self-pay | Admitting: Critical Care Medicine

## 2015-05-12 ENCOUNTER — Ambulatory Visit (HOSPITAL_COMMUNITY)
Admission: RE | Admit: 2015-05-12 | Discharge: 2015-05-12 | Disposition: A | Discharge status: Home / Self Care | Payer: 59 | Source: Ambulatory Visit | Attending: Otolaryngology | Admitting: Otolaryngology

## 2015-05-12 ENCOUNTER — Ambulatory Visit (HOSPITAL_COMMUNITY): Payer: 59 | Admitting: Critical Care Medicine

## 2015-05-12 ENCOUNTER — Encounter (HOSPITAL_COMMUNITY)
Admission: RE | Disposition: A | Discharge status: Home / Self Care | Payer: Self-pay | Source: Ambulatory Visit | Attending: Otolaryngology

## 2015-05-12 DIAGNOSIS — F419 Anxiety disorder, unspecified: Secondary | ICD-10-CM | POA: Diagnosis not present

## 2015-05-12 DIAGNOSIS — J3501 Chronic tonsillitis: Secondary | ICD-10-CM | POA: Diagnosis present

## 2015-05-12 HISTORY — PX: TONSILLECTOMY: SHX5217

## 2015-05-12 SURGERY — TONSILLECTOMY
Anesthesia: General | Site: Mouth | Laterality: Bilateral

## 2015-05-12 MED ORDER — HYDROMORPHONE HCL 1 MG/ML IJ SOLN
0.2500 mg | INTRAMUSCULAR | Status: DC | PRN
  Administered 2015-05-12 (×4): 0.5 mg via INTRAVENOUS

## 2015-05-12 MED ORDER — 0.9 % SODIUM CHLORIDE (POUR BTL) OPTIME
TOPICAL | Status: DC | PRN
  Administered 2015-05-12: 1000 mL

## 2015-05-12 MED ORDER — OXYMETAZOLINE HCL 0.05 % NA SOLN
NASAL | Status: DC | PRN
  Administered 2015-05-12: 1 via TOPICAL

## 2015-05-12 MED ORDER — OXYMETAZOLINE HCL 0.05 % NA SOLN
NASAL | Status: AC
  Filled 2015-05-12: qty 15

## 2015-05-12 MED ORDER — ONDANSETRON HCL 4 MG/2ML IJ SOLN
INTRAMUSCULAR | Status: DC | PRN
Start: 2015-05-12 — End: 2015-05-12
  Administered 2015-05-12: 4 mg via INTRAVENOUS

## 2015-05-12 MED ORDER — SUCCINYLCHOLINE CHLORIDE 20 MG/ML IJ SOLN
INTRAMUSCULAR | Status: DC | PRN
  Administered 2015-05-12: 80 mg via INTRAVENOUS

## 2015-05-12 MED ORDER — FENTANYL CITRATE (PF) 100 MCG/2ML IJ SOLN
INTRAMUSCULAR | Status: DC | PRN
  Administered 2015-05-12: 25 ug via INTRAVENOUS
  Administered 2015-05-12 (×2): 50 ug via INTRAVENOUS

## 2015-05-12 MED ORDER — HYDROMORPHONE HCL 1 MG/ML IJ SOLN
INTRAMUSCULAR | Status: AC
  Administered 2015-05-12: 0.5 mg via INTRAVENOUS
  Filled 2015-05-12: qty 1

## 2015-05-12 MED ORDER — PROPOFOL 10 MG/ML IV BOLUS
INTRAVENOUS | Status: DC | PRN
  Administered 2015-05-12: 140 mg via INTRAVENOUS

## 2015-05-12 MED ORDER — SCOPOLAMINE 1 MG/3DAYS TD PT72
1.0000 | MEDICATED_PATCH | Freq: Once | TRANSDERMAL | Status: DC
  Administered 2015-05-12: 1.5 mg via TRANSDERMAL

## 2015-05-12 MED ORDER — LIDOCAINE HCL (CARDIAC) 20 MG/ML IV SOLN
INTRAVENOUS | Status: DC | PRN
  Administered 2015-05-12: 60 mg via INTRAVENOUS

## 2015-05-12 MED ORDER — DEXAMETHASONE SODIUM PHOSPHATE 10 MG/ML IJ SOLN
10.0000 mg | Freq: Once | INTRAMUSCULAR | Status: AC
  Administered 2015-05-12: 10 mg via INTRAVENOUS
  Filled 2015-05-12: qty 1

## 2015-05-12 MED ORDER — LACTATED RINGERS IV SOLN
INTRAVENOUS | Status: DC
  Administered 2015-05-12: 13:00:00 via INTRAVENOUS

## 2015-05-12 MED ORDER — CLINDAMYCIN PHOSPHATE 600 MG/50ML IV SOLN
600.0000 mg | Freq: Once | INTRAVENOUS | Status: AC
  Administered 2015-05-12: 600 mg via INTRAVENOUS
  Filled 2015-05-12: qty 50

## 2015-05-12 MED ORDER — ONDANSETRON HCL 4 MG/2ML IJ SOLN
INTRAMUSCULAR | Status: AC
  Filled 2015-05-12: qty 2

## 2015-05-12 MED ORDER — FENTANYL CITRATE (PF) 250 MCG/5ML IJ SOLN
INTRAMUSCULAR | Status: AC
  Filled 2015-05-12: qty 5

## 2015-05-12 MED ORDER — DEXAMETHASONE SODIUM PHOSPHATE 10 MG/ML IJ SOLN
INTRAMUSCULAR | Status: AC
  Filled 2015-05-12: qty 1

## 2015-05-12 MED ORDER — MIDAZOLAM HCL 5 MG/5ML IJ SOLN
INTRAMUSCULAR | Status: DC | PRN
  Administered 2015-05-12: 2 mg via INTRAVENOUS

## 2015-05-12 MED ORDER — SCOPOLAMINE 1 MG/3DAYS TD PT72
MEDICATED_PATCH | TRANSDERMAL | Status: AC
  Administered 2015-05-12: 1.5 mg via TRANSDERMAL
  Filled 2015-05-12: qty 1

## 2015-05-12 MED ORDER — ONDANSETRON HCL 4 MG/2ML IJ SOLN
4.0000 mg | Freq: Once | INTRAMUSCULAR | Status: AC
  Administered 2015-05-12: 4 mg via INTRAVENOUS

## 2015-05-12 MED ORDER — MIDAZOLAM HCL 2 MG/2ML IJ SOLN
INTRAMUSCULAR | Status: AC
  Filled 2015-05-12: qty 2

## 2015-05-12 SURGICAL SUPPLY — 35 items
BLADE SURG 15 STRL LF DISP TIS (BLADE) IMPLANT
BLADE SURG 15 STRL SS (BLADE)
CANISTER SUCTION 2500CC (MISCELLANEOUS) ×2 IMPLANT
CATH ROBINSON RED A/P 10FR (CATHETERS) IMPLANT
CLEANER TIP ELECTROSURG 2X2 (MISCELLANEOUS) ×2 IMPLANT
COAGULATOR SUCT SWTCH 10FR 6 (ELECTROSURGICAL) ×1 IMPLANT
CONT SPEC 4OZ CLIKSEAL STRL BL (MISCELLANEOUS) ×2 IMPLANT
DRAPE PROXIMA HALF (DRAPES) ×1 IMPLANT
ELECT COATED BLADE 2.86 ST (ELECTRODE) ×2 IMPLANT
ELECT REM PT RETURN 9FT ADLT (ELECTROSURGICAL) ×2
ELECT REM PT RETURN 9FT PED (ELECTROSURGICAL)
ELECTRODE REM PT RETRN 9FT PED (ELECTROSURGICAL) IMPLANT
ELECTRODE REM PT RTRN 9FT ADLT (ELECTROSURGICAL) IMPLANT
GAUZE SPONGE 4X4 16PLY XRAY LF (GAUZE/BANDAGES/DRESSINGS) ×2 IMPLANT
GLOVE BIOGEL PI IND STRL 8 (GLOVE) IMPLANT
GLOVE BIOGEL PI INDICATOR 8 (GLOVE) ×1
GLOVE SURG SS PI 7.5 STRL IVOR (GLOVE) ×2 IMPLANT
GOWN STRL REUS W/ TWL LRG LVL3 (GOWN DISPOSABLE) ×1 IMPLANT
GOWN STRL REUS W/TWL LRG LVL3 (GOWN DISPOSABLE) ×4
KIT BASIN OR (CUSTOM PROCEDURE TRAY) ×2 IMPLANT
KIT ROOM TURNOVER OR (KITS) ×2 IMPLANT
NDL HYPO 25GX1X1/2 BEV (NEEDLE) IMPLANT
NEEDLE HYPO 25GX1X1/2 BEV (NEEDLE) IMPLANT
NS IRRIG 1000ML POUR BTL (IV SOLUTION) ×2 IMPLANT
PACK SURGICAL SETUP 50X90 (CUSTOM PROCEDURE TRAY) ×2 IMPLANT
PAD ARMBOARD 7.5X6 YLW CONV (MISCELLANEOUS) ×3 IMPLANT
PENCIL BUTTON HOLSTER BLD 10FT (ELECTRODE) ×2 IMPLANT
SPECIMEN JAR SMALL (MISCELLANEOUS) IMPLANT
SPONGE TONSIL 1 RF SGL (DISPOSABLE) ×1 IMPLANT
SYR BULB 3OZ (MISCELLANEOUS) ×2 IMPLANT
TOWEL OR 17X24 6PK STRL BLUE (TOWEL DISPOSABLE) ×4 IMPLANT
TUBE CONNECTING 12X1/4 (SUCTIONS) ×2 IMPLANT
TUBE SALEM SUMP 12R W/ARV (TUBING) IMPLANT
WATER STERILE IRR 1000ML POUR (IV SOLUTION) IMPLANT
YANKAUER SUCT BULB TIP NO VENT (SUCTIONS) ×2 IMPLANT

## 2015-05-12 NOTE — Anesthesia Preprocedure Evaluation (Addendum)
Anesthesia Evaluation  Patient identified by MRN, date of birth, ID band Patient awake    Reviewed: Allergy & Precautions, H&P , NPO status , Patient's Chart, lab work & pertinent test results  History of Anesthesia Complications (+) PONV  Airway Mallampati: I  TM Distance: >3 FB Neck ROM: Full    Dental no notable dental hx. (+) Dental Advisory Given, Teeth Intact   Pulmonary neg pulmonary ROS,  breath sounds clear to auscultation  Pulmonary exam normal       Cardiovascular negative cardio ROS  Rhythm:Regular Rate:Normal     Neuro/Psych Anxiety negative neurological ROS  negative psych ROS   GI/Hepatic negative GI ROS, Neg liver ROS,   Endo/Other  negative endocrine ROS  Renal/GU negative Renal ROS  negative genitourinary   Musculoskeletal   Abdominal   Peds  Hematology negative hematology ROS (+)   Anesthesia Other Findings   Reproductive/Obstetrics negative OB ROS                           Anesthesia Physical Anesthesia Plan  ASA: I  Anesthesia Plan: General   Post-op Pain Management:    Induction: Intravenous  Airway Management Planned: Oral ETT  Additional Equipment:   Intra-op Plan:   Post-operative Plan: Extubation in OR  Informed Consent: I have reviewed the patients History and Physical, chart, labs and discussed the procedure including the risks, benefits and alternatives for the proposed anesthesia with the patient or authorized representative who has indicated his/her understanding and acceptance.   Dental advisory given  Plan Discussed with: Anesthesiologist and Surgeon  Anesthesia Plan Comments:         Anesthesia Quick Evaluation

## 2015-05-12 NOTE — Anesthesia Procedure Notes (Signed)
Procedure Name: Intubation Date/Time: 05/12/2015 2:39 PM Performed by: Glo Herring B Pre-anesthesia Checklist: Patient identified, Timeout performed, Emergency Drugs available, Suction available and Patient being monitored Patient Re-evaluated:Patient Re-evaluated prior to inductionOxygen Delivery Method: Circle system utilized Preoxygenation: Pre-oxygenation with 100% oxygen Intubation Type: IV induction Ventilation: Mask ventilation without difficulty Laryngoscope Size: Mac and 3 Grade View: Grade I Tube type: Oral Tube size: 7.0 mm Number of attempts: 1 Airway Equipment and Method: Stylet Placement Confirmation: CO2 detector,  positive ETCO2,  ETT inserted through vocal cords under direct vision and breath sounds checked- equal and bilateral Secured at: 21 cm Tube secured with: Tape Dental Injury: Teeth and Oropharynx as per pre-operative assessment

## 2015-05-12 NOTE — Op Note (Signed)
DATE OF OPERATION: 05/12/2015 Surgeon: Melvenia Beam Procedure Performed: 41583 bilateral tonsillectomy  PREOPERATIVE DIAGNOSIS:tonsillar hypertrophy and recurrent/chronic tonsillitis  POSTOPERATIVE DIAGNOSIS: tonsillar hypertrophy and recurrent/chronic tonsillitis SURGEON: Melvenia Beam ANESTHESIA: General endotracheal.  ESTIMATED BLOOD LOSS: minimal  DRAINS: none SPECIMENS: R & L tonsil INDICATIONS: The patient is a 29yo with a history of tonsillar hypertrophy and recurrent/chronic tonsillitis DESCRIPTION OF OPERATION: The patient was brought to the operating room and was placed in the supine position and was placed under general endotracheal anesthesia by anesthesiology. The bed was turned 90 degrees and the Crowe-Davis mouth retractor was placed over the endotracheal tube and suspended from the Mayo stand. The palate was inspected and palpated and noted to be intact with no submucous cleft. The uvula was midline and normal.  Next the right tonsil was grasped with a curved Allis clamp and dissected from the right tonsillar fossa using the Bovie. Meticulous hemostasis was then achieved. The left tonsil was then grasped with the curved Allis and dissected from the left tonsillar fossa using the Bovie. Meticulous hemostasis was achieved. The nasal cavity and oropharynx were irrigated out and then the the nose, oral cavity,  and stomach were suctioned out. The patient was turned back to anesthesia and awakened from anesthesia and extubated without difficulty. The patient tolerated the procedure well with no immediate complications and was taken to the postoperative recovery area in good condition.   Dr. Melvenia Beam was present and performed the entire procedure. 05/12/2015  3:06 PM Melvenia Beam

## 2015-05-12 NOTE — Discharge Instructions (Signed)
Rx for hydrocodone given to family, Rx for antibiotic and zofran sent to pharmacy. Follow up with Dr. Emeline Darling in 3-4 weeks. Up ad lib, post-tonsillectomy diet as tolerated.

## 2015-05-12 NOTE — Transfer of Care (Signed)
Immediate Anesthesia Transfer of Care Note  Patient: Kimberly Chambers  Procedure(s) Performed: Procedure(s): TONSILLECTOMY (Bilateral)  Patient Location: PACU  Anesthesia Type:General  Level of Consciousness: awake, alert  and oriented  Airway & Oxygen Therapy: Patient Spontanous Breathing and Patient connected to nasal cannula oxygen  Post-op Assessment: Report given to RN, Post -op Vital signs reviewed and stable and Patient moving all extremities X 4  Post vital signs: Reviewed and stable  Last Vitals:  Filed Vitals:   05/12/15 1254  BP: 119/64  Pulse: 74  Temp: 37.1 C  Resp: 18   HR 79, RR 10, Sats 100%, BP 122/74 Complications: No apparent anesthesia complications

## 2015-05-13 ENCOUNTER — Encounter (HOSPITAL_COMMUNITY): Payer: Self-pay | Admitting: Otolaryngology

## 2015-05-13 NOTE — Discharge Summary (Signed)
05/12/15 4:34 PM  Date of Admission: 05/12/2015 Date of Discharge: 05/12/2015  Discharge MD: Melvenia Beam, MD  Admitting MD: Melvenia Beam, MD  Reason for admission/final discharge diagnosis: recurrent tonsillitis  Labs: see EPIC  Procedure(s) performed: tonsillectomy 05/12/15  Discharge Condition: good  Discharge Exam:oral cavity hemostatic with tonsils surgically absent  Discharge Instructions: No heavy lifting, post-tonsillectomy diet, follow up with  Dr. Emeline Darling At [facility] in 3 to 4 weeks  Hospital Course: had outpatient tonsillectomy, discharged from PACU  Melvenia Beam 4:34 PM  05/12/15

## 2015-05-13 NOTE — Anesthesia Postprocedure Evaluation (Signed)
  Anesthesia Post-op Note  Patient: Kimberly Chambers  Procedure(s) Performed: Procedure(s): TONSILLECTOMY (Bilateral)  Patient Location: PACU  Anesthesia Type:General  Level of Consciousness: awake and alert   Airway and Oxygen Therapy: Patient Spontanous Breathing  Post-op Pain: none  Post-op Assessment: Post-op Vital signs reviewed, Patient's Cardiovascular Status Stable and Respiratory Function Stable  Post-op Vital Signs: Reviewed  Filed Vitals:   05/12/15 1700  BP: 99/50  Pulse:   Temp:   Resp:     Complications: No apparent anesthesia complications

## 2015-10-11 ENCOUNTER — Encounter: Payer: Self-pay | Admitting: Gastroenterology

## 2015-11-29 DIAGNOSIS — H9313 Tinnitus, bilateral: Secondary | ICD-10-CM | POA: Diagnosis not present

## 2015-11-29 DIAGNOSIS — H903 Sensorineural hearing loss, bilateral: Secondary | ICD-10-CM | POA: Diagnosis not present

## 2015-12-10 DIAGNOSIS — K219 Gastro-esophageal reflux disease without esophagitis: Secondary | ICD-10-CM | POA: Diagnosis not present

## 2015-12-14 ENCOUNTER — Ambulatory Visit: Payer: Self-pay | Admitting: Gastroenterology

## 2015-12-27 IMAGING — CR DG KNEE COMPLETE 4+V*L*
4 series · 4 of 4 positions shown · non-contrast
Comparison: None

CLINICAL DATA: Injury to LEFT knee 1 week ago, anterior pain and
swelling

EXAM:
LEFT KNEE - COMPLETE 4+ VIEW

[view not recorded (1 of 4)]
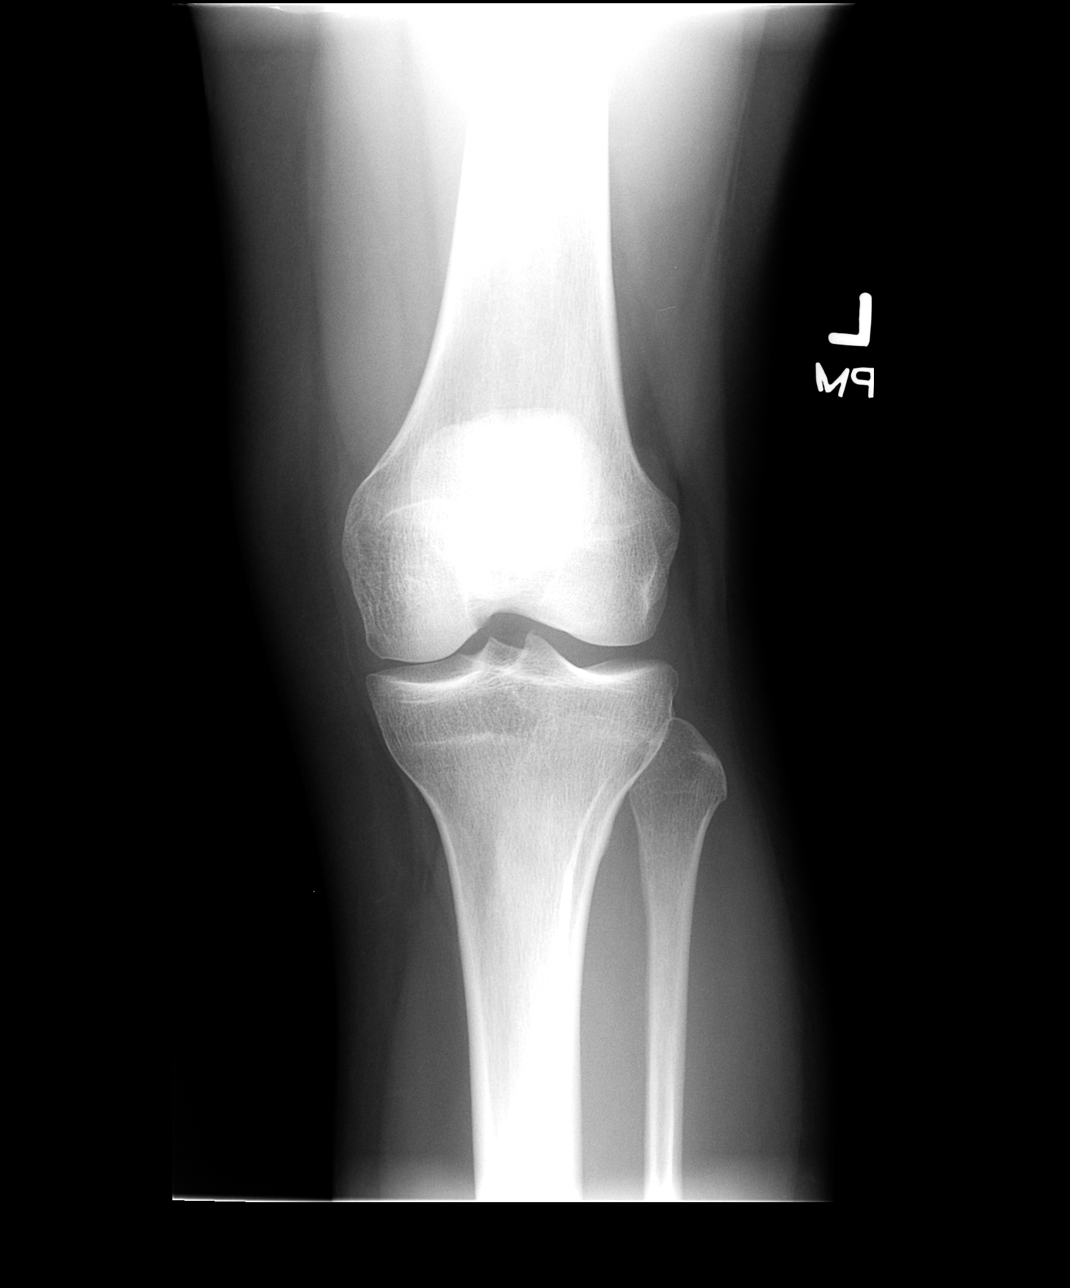

[view not recorded (2 of 4)]
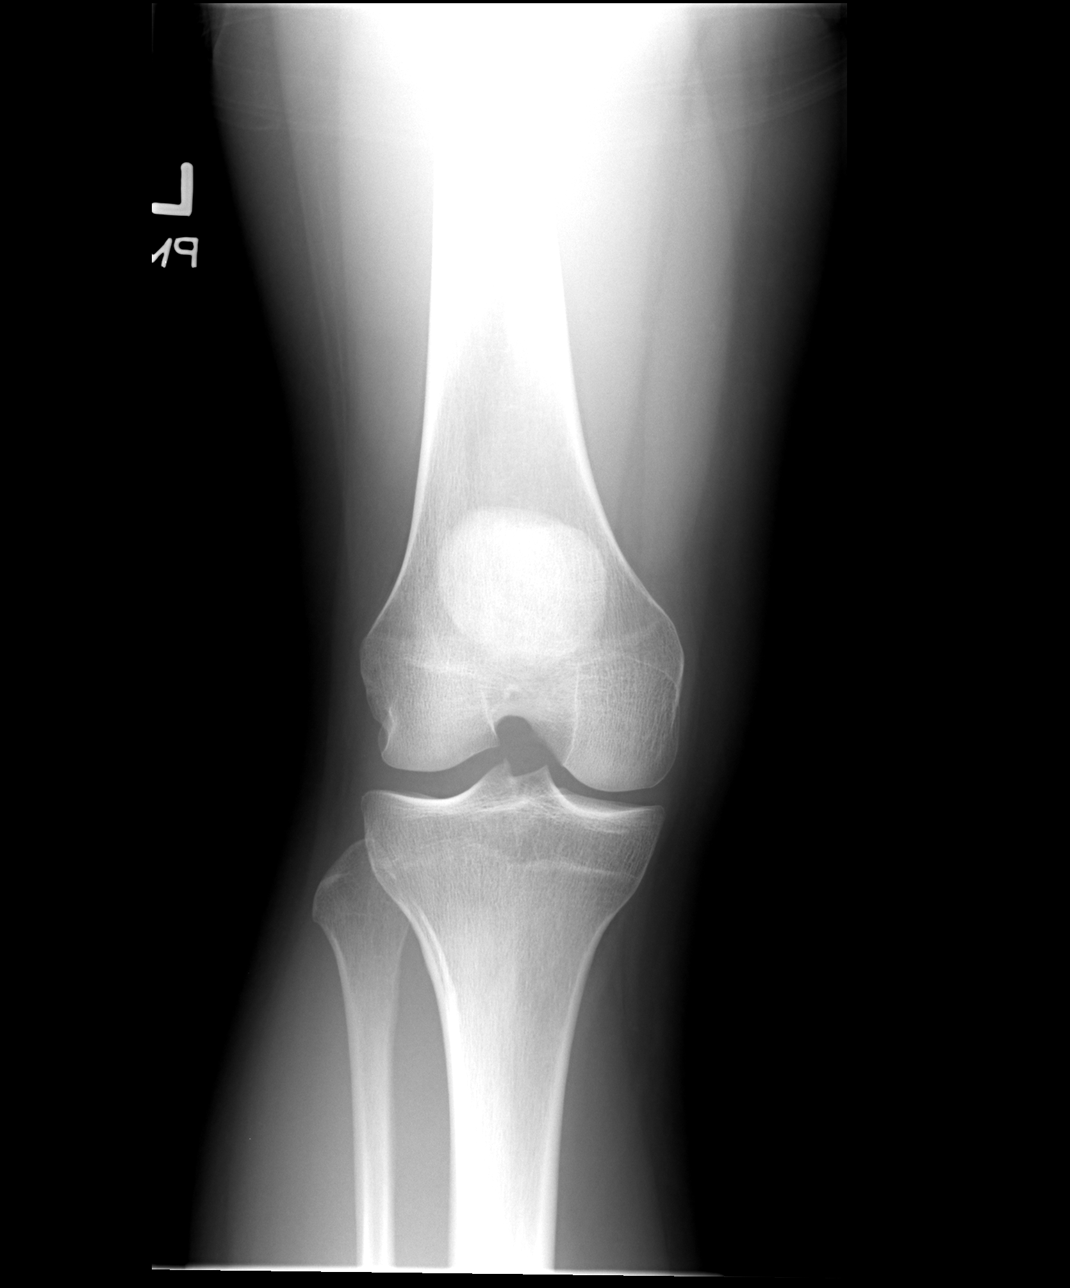

[view not recorded (3 of 4)]
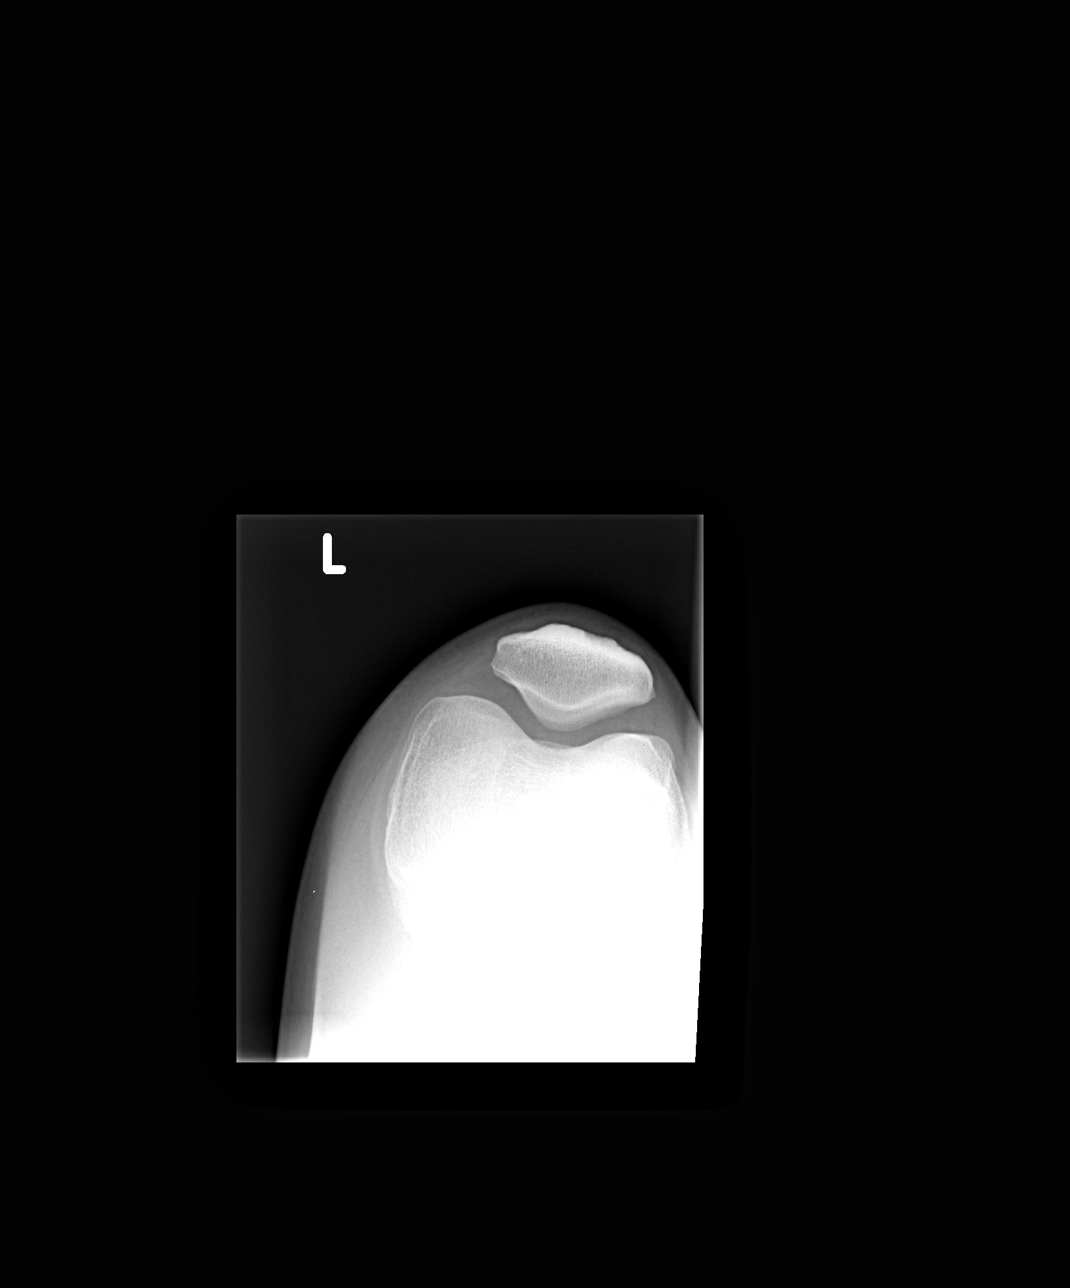

[view not recorded (4 of 4)]
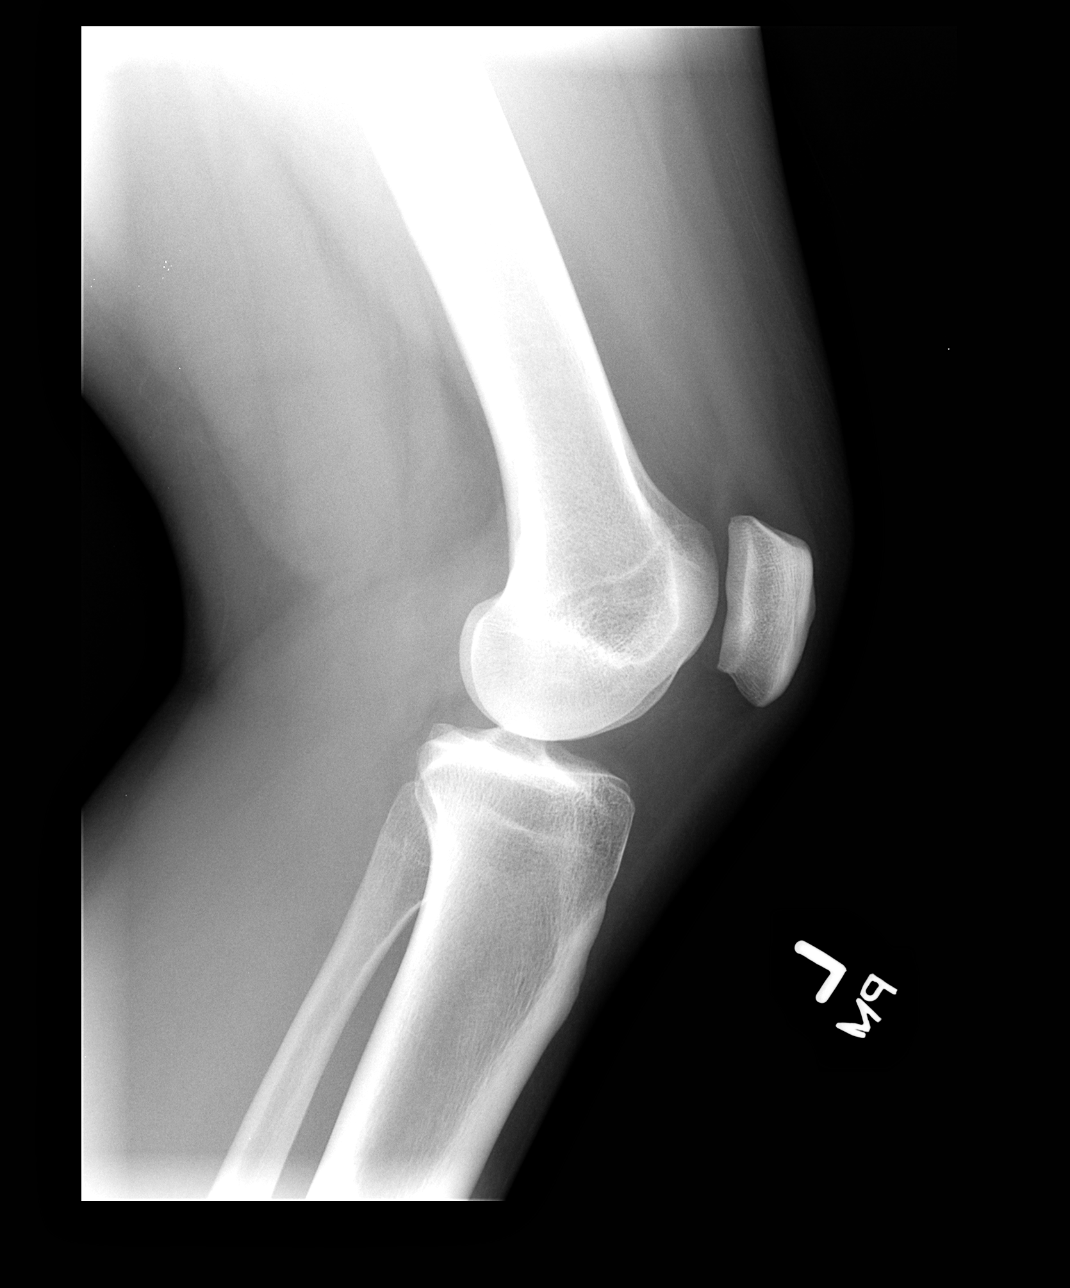

[4 of 4 positions shown; findings below may reference images not displayed]

FINDINGS: Bone mineralization normal.

Joint spaces preserved.

No fracture, dislocation, or bone destruction.

No joint effusion.
IMPRESSION: Normal exam.

## 2016-01-05 DIAGNOSIS — L219 Seborrheic dermatitis, unspecified: Secondary | ICD-10-CM | POA: Diagnosis not present

## 2016-01-05 DIAGNOSIS — B309 Viral conjunctivitis, unspecified: Secondary | ICD-10-CM | POA: Diagnosis not present

## 2016-01-05 MED FILL — CLOBETASOL 0.05% SOLUTION: 0.05 | 20 days supply | Qty: 50 | Fill #0

## 2016-02-13 ENCOUNTER — Encounter: Payer: Self-pay | Admitting: Family Medicine

## 2016-02-29 ENCOUNTER — Encounter: Payer: Self-pay | Admitting: Family Medicine

## 2016-02-29 ENCOUNTER — Ambulatory Visit (INDEPENDENT_AMBULATORY_CARE_PROVIDER_SITE_OTHER): Payer: 59 | Admitting: Family Medicine

## 2016-02-29 VITALS — BP 104/66 | HR 60 | Ht 60.0 in | Wt 116.0 lb

## 2016-02-29 DIAGNOSIS — M9901 Segmental and somatic dysfunction of cervical region: Secondary | ICD-10-CM | POA: Diagnosis not present

## 2016-02-29 DIAGNOSIS — M9902 Segmental and somatic dysfunction of thoracic region: Secondary | ICD-10-CM | POA: Diagnosis not present

## 2016-02-29 DIAGNOSIS — M940 Chondrocostal junction syndrome [Tietze]: Secondary | ICD-10-CM | POA: Diagnosis not present

## 2016-02-29 DIAGNOSIS — M9908 Segmental and somatic dysfunction of rib cage: Secondary | ICD-10-CM

## 2016-02-29 DIAGNOSIS — M999 Biomechanical lesion, unspecified: Secondary | ICD-10-CM | POA: Insufficient documentation

## 2016-02-29 NOTE — Patient Instructions (Signed)
Always good to see you  Ice is if you need it. Ice 20 minutes 2 times daily. Usually after activity and before bed. I think you are doing good and possible slipped rib happen.  I think it was a fluke.  Bromelain 2400 gcu 3 times daily with meals Consider turmeric 500mg  twice daily but could make reflux worse See me again in 4-6 weeks for manipulation if it helped.

## 2016-02-29 NOTE — Assessment & Plan Note (Signed)
Patient may have had more of a slipped rib syndrome. I do think that the differential does include reflux disease. We discussed icing regimen, some mild posture and ergonomics changes. We discussed that I think that this is likely not going to be a long-term problem with patient not having any significant muscle imbalances. We discussed icing regimen again. Patient will come back and see me again in 4-6 weeks. If worsening symptoms we can consider further workup but I don't think it will be necessary.

## 2016-02-29 NOTE — Progress Notes (Signed)
[facility] [address] [place], [place] [address] Phone: [phone] Subjective:    I'm seeing this patient by the request  of:  ESCAJEDA, RICHARD, MD   CC: rib pain up her back pain  Kimberly Chambers is a 30 y.o. female coming in with complaint of left rib pain. Patient states that she has had this since November. Remembers when she ate a very bad medial. Patient states that she was seen by gastroenterologist and side of his may be reflux. She did do some medications that helped out a little bit. Never completely resolved it. States that if she makes certain movements she has pain. Seems to be on the left side. Can radiate around. No rash. Rates the severity of pain a 4 out of 10. Just concerned cousin has never gone away. Continues to work out on a regular basis.     Past Medical History  Diagnosis Date  . H/O candidiasis   . H/O varicella   . PONV (postoperative nausea and vomiting)   . Sore throat   . Anxiety    Past Surgical History  Procedure Laterality Date  . Wisdom tooth extraction      x4, 2002  . Tympanostomy tube placement      as child   . Lingual frenectomy    . Tonsillectomy Bilateral 05/12/2015    Procedure: TONSILLECTOMY;  Surgeon: Melvenia Beam, MD;  Location: [facility] OR;  Service: ENT;  Laterality: Bilateral;   Social History   Social History  . Marital Status: Married    Spouse Name: N/A  . Number of Children: N/A  . Years of Education: N/A   Social History Main Topics  . Smoking status: Never Smoker   . Smokeless tobacco: Never Used  . Alcohol Use: Yes     Comment: socially  . Drug Use: No  . Sexual Activity:    Partners: Male    Birth Control/ Protection: Abstinence   Other Topics Concern  . Not on file   Social History Narrative   Allergies  Allergen Reactions  . Amoxicillin-Pot Clavulanate Hives and Swelling    Childhood reaction-  . Ceclor [Cefaclor] Hives and Swelling    Childhood reaction-  hives, slight throat swelling   Family History  Problem Relation Age of Onset  . Cancer Maternal Aunt   . Hypertension Maternal Grandmother   . Stroke Maternal Grandmother   . Hypertension Maternal Grandfather   . Cancer Paternal Grandmother     Past medical history, social, surgical and family history all reviewed in electronic medical record.  No pertanent information unless stated regarding to the chief complaint.   Review of Systems: No headache, visual changes, nausea, vomiting, diarrhea, constipation, dizziness, abdominal pain, skin rash, fevers, chills, night sweats, weight loss, swollen lymph nodes, body aches, joint swelling, muscle aches, chest pain, shortness of breath, mood changes.   Objective Blood pressure 104/66, pulse 60, height 5' (1.524 m), weight 116 lb (52.617 kg), SpO2 96 %, currently breastfeeding.  General: No apparent distress alert and oriented x3 mood and affect normal, dressed appropriately.  HEENT: Pupils equal, extraocular movements intact  Respiratory: Patient's speak in full sentences and does not appear short of breath  Cardiovascular: No lower extremity edema, non tender, no erythema  Skin: Warm dry intact with no signs of infection or rash on extremities or on axial skeleton.  Abdomen: Soft nontender  Neuro: Cranial nerves II through XII are intact, neurovascularly intact in all extremities  with 2+ DTRs and 2+ pulses.  Lymph: No lymphadenopathy of posterior or anterior cervical chain or axillae bilaterally.  Gait normal with good balance and coordination.  MSK:  Non tender with full range of motion and good stability and symmetric strength and tone of shoulders, elbows, wrist, hip, knee and ankles bilaterally.  Back Exam:  Inspection: Unremarkable  Motion: Flexion 45 deg, Extension 25 deg, Side Bending to 45 deg bilaterally,  Rotation to 45 deg bilaterally  SLR laying: Negative  XSLR laying: Negative  Palpable tenderness: mild tenderness over the  eighth rib on the left side FABER: negative. Sensory change: Gross sensation intact to all lumbar and sacral dermatomes.  Reflexes: 2+ at both patellar tendons, 2+ at achilles tendons, Babinski's downgoing.  Strength at foot  Plantar-flexion: 5/5 Dorsi-flexion: 5/5 Eversion: 5/5 Inversion: 5/5  Leg strength  Quad: 5/5 Hamstring: 5/5 Hip flexor: 4/5 Hip abductors: 4/5  Gait unremarkable.    Osteopathic findings.  C2 F RS right C4 F RS right  T3 E RS left with inhaled 3rd rib T8 E RS left with slipped rib   Impression and Recommendations:     This case required medical decision making of moderate complexity.      Note: This dictation was prepared with Dragon dictation along with smaller phrase technology. Any transcriptional errors that result from this process are unintentional.

## 2016-02-29 NOTE — Assessment & Plan Note (Signed)
Decision today to treat with OMT was based on Physical Exam  After verbal consent patient was treated with HVLA, ME techniques in cervical, thoracic and rib areas  Patient tolerated the procedure well with improvement in symptoms  Patient given exercises, stretches and lifestyle modifications  See medications in patient instructions if given  Patient will follow up in 4-6 weeks                                           

## 2016-02-29 NOTE — Progress Notes (Signed)
Pre visit review using our clinic review tool, if applicable. No additional management support is needed unless otherwise documented below in the visit note. 

## 2016-04-14 ENCOUNTER — Ambulatory Visit: Payer: 59 | Admitting: Family Medicine

## 2016-05-18 DIAGNOSIS — L700 Acne vulgaris: Secondary | ICD-10-CM | POA: Diagnosis not present

## 2016-06-19 DIAGNOSIS — Z319 Encounter for procreative management, unspecified: Secondary | ICD-10-CM | POA: Diagnosis not present

## 2016-06-19 DIAGNOSIS — Z124 Encounter for screening for malignant neoplasm of cervix: Secondary | ICD-10-CM | POA: Diagnosis not present

## 2016-06-19 DIAGNOSIS — Z6822 Body mass index (BMI) 22.0-22.9, adult: Secondary | ICD-10-CM | POA: Diagnosis not present

## 2016-06-19 DIAGNOSIS — Z01419 Encounter for gynecological examination (general) (routine) without abnormal findings: Secondary | ICD-10-CM | POA: Diagnosis not present

## 2016-06-30 DIAGNOSIS — Z0000 Encounter for general adult medical examination without abnormal findings: Secondary | ICD-10-CM | POA: Diagnosis not present

## 2016-11-20 NOTE — L&D Delivery Note (Signed)
Delivery Note   At 10:19 AM a viable female, named Kimberly Chambers,  was delivered via Vaginal, Spontaneous Delivery (Presentation:ROA  ).  APGAR: 9, 9  Placenta status: complete, spontaneous Cord:  Single umbilical artery and 2 true knots  Anesthesia:  epidural Episiotomy: None Lacerations:  Intact perineum Suture Repair: 4-0 Monocryl for left vulvar laceration Est. Blood Loss (mL): 200  Mom to postpartum.  Baby to Couplet care / Skin to Skin  .  Kimberly Chambers A 09/18/2017, 10:42 AM

## 2017-01-22 LAB — OB RESULTS CONSOLE GC/CHLAMYDIA
Chlamydia: NEGATIVE
Gonorrhea: NEGATIVE

## 2017-01-22 LAB — OB RESULTS CONSOLE RPR: RPR: NONREACTIVE

## 2017-01-22 LAB — OB RESULTS CONSOLE HEPATITIS B SURFACE ANTIGEN: HEP B S AG: NEGATIVE

## 2017-01-22 LAB — OB RESULTS CONSOLE ANTIBODY SCREEN: Antibody Screen: NEGATIVE

## 2017-01-22 LAB — OB RESULTS CONSOLE RUBELLA ANTIBODY, IGM: Rubella: IMMUNE

## 2017-01-22 LAB — OB RESULTS CONSOLE ABO/RH: RH Type: POSITIVE

## 2017-01-22 LAB — OB RESULTS CONSOLE HIV ANTIBODY (ROUTINE TESTING): HIV: NONREACTIVE

## 2017-09-10 ENCOUNTER — Inpatient Hospital Stay (HOSPITAL_COMMUNITY): Admission: AD | Admit: 2017-09-10 | Payer: 59 | Source: Ambulatory Visit | Admitting: Obstetrics and Gynecology

## 2017-09-14 ENCOUNTER — Other Ambulatory Visit: Payer: Self-pay | Admitting: Obstetrics and Gynecology

## 2017-09-14 ENCOUNTER — Encounter (HOSPITAL_COMMUNITY): Payer: Self-pay | Admitting: *Deleted

## 2017-09-14 ENCOUNTER — Telehealth (HOSPITAL_COMMUNITY): Payer: Self-pay | Admitting: *Deleted

## 2017-09-14 NOTE — Telephone Encounter (Signed)
Preadmission screen  

## 2017-09-17 ENCOUNTER — Encounter (HOSPITAL_COMMUNITY): Payer: Self-pay | Admitting: *Deleted

## 2017-09-17 ENCOUNTER — Telehealth (HOSPITAL_COMMUNITY): Payer: Self-pay | Admitting: *Deleted

## 2017-09-17 NOTE — H&P (Signed)
Kimberly Chambers is a 31 y.o. female, G2P1001 at 40.1 weeks, presenting for IOL s/t single umbilical artery.  Patient is under the care of [facility] and pregnancy and medical history significant for SUA and augmentin, ceclor, and egg allergy.  Patient is GBS negative and expresses desire for minimal pain medication for coping.  However, patient is open to and a candidate for N2O usage.  Patient is anticipating a female Lamar Benes.    Patient Active Problem List   Diagnosis Date Noted  . 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 Nonallopathic lesion-rib cage 02/29/2016  . Nonallopathic lesion of cervical region 02/29/2016  . Nonallopathic lesion of thoracic region 02/29/2016  . Patellofemoral syndrome 12/23/2014  . Abnormal EKG 04/20/2014  . SOB (shortness of breath) 04/20/2014  . Palpitations 04/20/2014  . H/O candidiasis   . H/O varicella   . Hepatitis B   . Postpartum exam 03/14/2012  . Contraception management 03/14/2012    History of present pregnancy: Patient entered care at 6 weeks.   EDC of 09/17/2017 was established by Definite LMP of 12/11/2016.   Anatomy scan:  20 weeks, with normal findings and an anterior placenta.   Additional [place] evaluations:  EFW 14%ILE, 2 VC NOTED, NORMAL ANATOMY, INCLUDING KIDNEYS, PLACENTA ANTERIOR, CERVIX 3.75 36wks for Growth: 48%ile, 6lbs 5oz 37wks BPP 8/8 anterior placenta AFI 55th%tile 38wks BPP 8/8 Significant prenatal events: Exposure to 5ths Disease at 30wks with immunity noted  Last evaluation:  09/13/2017 in office by V. Emilee Hero, CNM 3/50/-2 wt 122lbs  OB History    Gravida Para Term Preterm AB Living   2 1 1     1    SAB TAB Ectopic Multiple Live Births           1    30-39wks Female Child via SVD in H2O 5lbs 15oz   Past Medical History:  Diagnosis Date  . Anxiety   . H/O candidiasis   . H/O varicella   . Hearing loss   . PONV (postoperative nausea and vomiting)   . Sore throat    Past Surgical History:  Procedure Laterality Date   . LINGUAL FRENECTOMY    . TONSILLECTOMY Bilateral 05/12/2015   Procedure: TONSILLECTOMY;  Surgeon: Melvenia Beam, MD;  Location: [facility] OR;  Service: ENT;  Laterality: Bilateral;  . TYMPANOSTOMY TUBE PLACEMENT     as child   . WISDOM TOOTH EXTRACTION     x4, 2002   Family History: family history includes Cancer in her maternal aunt and paternal grandmother; Hypertension in her maternal grandfather and maternal grandmother; Parkinson's disease in her maternal grandfather; Stroke in her maternal grandmother. Social History:  reports that she has never smoked. She has never used smokeless tobacco. She reports that she drinks alcohol. She reports that she does not use drugs.   Prenatal Transfer Tool  Maternal Diabetes: No Genetic Screening: Declined Maternal Ultrasounds/Referrals: Normal Fetal Ultrasounds or other Referrals:  None Maternal Substance Abuse:  No Significant Maternal Medications:  None Significant Maternal Lab Results: Lab values include: Group B Strep negative    ROS:  +FM, -LoF, -VB, -Ctx No recent illness No GI or urinary concerns  Allergies  Allergen Reactions  . Amoxicillin-Pot Clavulanate Hives and Swelling    Childhood reaction-  . Ceclor [Cefaclor] Hives and Swelling    Childhood reaction- hives, slight throat swelling  . Eggs Or Egg-Derived Products         Physical Exam  Constitutional: She is oriented to person, place, and time. She  appears well-developed and well-nourished. No distress.  HENT:  Head: Normocephalic and atraumatic.  Eyes: Pupils are equal, round, and reactive to light. Conjunctivae are normal.  Neck: Normal range of motion.  Cardiovascular: Normal rate, regular rhythm and normal heart sounds.   Respiratory: Effort normal and breath sounds normal.  GI: Soft. Bowel sounds are normal.  Gravid--fundal height appears AGA, Soft, NT  Musculoskeletal: Normal range of motion. She exhibits no edema.  Neurological: She is alert and oriented to  person, place, and time.  Skin: Skin is warm and dry.    Leopolds: EFW: 6lbs 3/4 oz-7;bs Presentation: Vertex  FHR: 145 bpm, Mod Var, -Decels, +Accels UCs:  None  Dilation: 3 Effacement (%): 50 Station: -2 Presentation: Vertex Exam by:: Tammara Massing,cnm   Prenatal labs: ABO, Rh: A/Positive/-- (03/05 0000) Antibody: Negative (03/05 0000) Rubella:  Immune  RPR: Nonreactive (03/05 0000)  HBsAg: Negative (03/05 0000)  HIV: Non-reactive (03/05 0000)  GBS:  Negative  Sickle cell/Hgb electrophoresis:  N/A  Pap:  Unknown GC:  ND Chlamydia:  ND Other:  ParvoVirus-Immune    Assessment IUP at 40wks Cat 1 FT Single Umbilical Artery IOL Bishop Score: 6 Desires Minimal Pain Medication Usage   [facility]: [facility] Routine Labor and Delivery Orders per [facility] Protocol In room to complete assessment and discuss POC: -Given option for Pitocin vs AROM -Discussed r/b of pitocin including fetal intolerance, tachysystole, as well as stronger contractions and decreased labor time -Discussed AROM r/b including increased risk of infection, cord prolapse, fetal intolerance, and decreased labor time.  -Questions and concerns addressed -Informed of need for constant monitoring d/t SUA -Patient opts for pitocin start with re-evaluation for AROM -Will start pitocin at 1x1 to be titrated as appropriate -Will recheck and evaluate for AROM at 0600 or earlier as appropriate Dr.EV to be updated as appropriate  Joellyn QuailsJessica L EmlyCNM, MSN 09/17/2017, 9:01 PM

## 2017-09-17 NOTE — Telephone Encounter (Signed)
Preadmission screen  

## 2017-09-18 ENCOUNTER — Encounter (HOSPITAL_COMMUNITY): Payer: Self-pay

## 2017-09-18 ENCOUNTER — Inpatient Hospital Stay (HOSPITAL_COMMUNITY): Payer: BC Managed Care – PPO | Admitting: Anesthesiology

## 2017-09-18 ENCOUNTER — Inpatient Hospital Stay (HOSPITAL_COMMUNITY)
Admission: RE | Admit: 2017-09-18 | Discharge: 2017-09-19 | DRG: 768 | Disposition: A | Discharge status: Home / Self Care | Payer: BC Managed Care – PPO | Source: Ambulatory Visit | Attending: Obstetrics and Gynecology | Admitting: Obstetrics and Gynecology

## 2017-09-18 DIAGNOSIS — Z3A40 40 weeks gestation of pregnancy: Secondary | ICD-10-CM | POA: Diagnosis not present

## 2017-09-18 LAB — CBC
HEMATOCRIT: 32.5 % — AB (ref 36.0–46.0)
Hemoglobin: 11.2 g/dL — ABNORMAL LOW (ref 12.0–15.0)
MCH: 32.6 pg (ref 26.0–34.0)
MCHC: 34.5 g/dL (ref 30.0–36.0)
MCV: 94.5 fL (ref 78.0–100.0)
Platelets: 193 10*3/uL (ref 150–400)
RBC: 3.44 MIL/uL — ABNORMAL LOW (ref 3.87–5.11)
RDW: 12.5 % (ref 11.5–15.5)
WBC: 12.7 10*3/uL — AB (ref 4.0–10.5)

## 2017-09-18 LAB — TYPE AND SCREEN
ABO/RH(D): A POS
Antibody Screen: NEGATIVE

## 2017-09-18 LAB — ABO/RH: ABO/RH(D): A POS

## 2017-09-18 LAB — RPR: RPR Ser Ql: NONREACTIVE

## 2017-09-18 MED ORDER — WITCH HAZEL-GLYCERIN EX PADS: 1.0000 "application " | MEDICATED_PAD | CUTANEOUS | Status: DC | PRN

## 2017-09-18 MED ORDER — ACETAMINOPHEN 325 MG PO TABS: 650.0000 mg | ORAL_TABLET | ORAL | Status: DC | PRN

## 2017-09-18 MED ORDER — ACETAMINOPHEN 325 MG PO TABS
650.0000 mg | ORAL_TABLET | ORAL | Status: DC | PRN
  Administered 2017-09-18 – 2017-09-19 (×2): 650 mg via ORAL
  Filled 2017-09-18 (×2): qty 2

## 2017-09-18 MED ORDER — IBUPROFEN 600 MG PO TABS
600.0000 mg | ORAL_TABLET | Freq: Four times a day (QID) | ORAL | Status: DC
  Administered 2017-09-18 – 2017-09-19 (×4): 600 mg via ORAL
  Filled 2017-09-18 (×4): qty 1

## 2017-09-18 MED ORDER — EPHEDRINE 5 MG/ML INJ
10.0000 mg | INTRAVENOUS | Status: DC | PRN
  Filled 2017-09-18: qty 2

## 2017-09-18 MED ORDER — TERBUTALINE SULFATE 1 MG/ML IJ SOLN
0.2500 mg | Freq: Once | INTRAMUSCULAR | Status: DC | PRN
Start: 2017-09-18 — End: 2017-09-18
  Filled 2017-09-18: qty 1

## 2017-09-18 MED ORDER — ZOLPIDEM TARTRATE 5 MG PO TABS: 5.0000 mg | ORAL_TABLET | Freq: Every evening | ORAL | Status: DC | PRN

## 2017-09-18 MED ORDER — LIDOCAINE HCL (PF) 1 % IJ SOLN
INTRAMUSCULAR | Status: DC | PRN
  Administered 2017-09-18: 7 mL via EPIDURAL
  Administered 2017-09-18: 5 mL via EPIDURAL

## 2017-09-18 MED ORDER — OXYTOCIN 40 UNITS IN LACTATED RINGERS INFUSION - SIMPLE MED: 2.5000 [IU]/h | INTRAVENOUS | Status: DC

## 2017-09-18 MED ORDER — OXYTOCIN BOLUS FROM INFUSION
500.0000 mL | Freq: Once | INTRAVENOUS | Status: AC
  Administered 2017-09-18: 500 mL via INTRAVENOUS

## 2017-09-18 MED ORDER — LACTATED RINGERS IV SOLN
500.0000 mL | INTRAVENOUS | Status: DC | PRN
  Administered 2017-09-18: 500 mL via INTRAVENOUS

## 2017-09-18 MED ORDER — SIMETHICONE 80 MG PO CHEW: 80.0000 mg | CHEWABLE_TABLET | ORAL | Status: DC | PRN

## 2017-09-18 MED ORDER — PRENATAL MULTIVITAMIN CH
1.0000 | ORAL_TABLET | Freq: Every day | ORAL | Status: DC
  Administered 2017-09-19: 1 via ORAL
  Filled 2017-09-18: qty 1

## 2017-09-18 MED ORDER — ONDANSETRON HCL 4 MG/2ML IJ SOLN: 4.0000 mg | INTRAMUSCULAR | Status: DC | PRN

## 2017-09-18 MED ORDER — ONDANSETRON HCL 4 MG PO TABS: 4.0000 mg | ORAL_TABLET | ORAL | Status: DC | PRN

## 2017-09-18 MED ORDER — SOD CITRATE-CITRIC ACID 500-334 MG/5ML PO SOLN: 30.0000 mL | ORAL | Status: DC | PRN

## 2017-09-18 MED ORDER — LACTATED RINGERS IV SOLN
INTRAVENOUS | Status: DC
  Administered 2017-09-18: 125 mL/h via INTRAVENOUS

## 2017-09-18 MED ORDER — FERROUS SULFATE 325 (65 FE) MG PO TABS
325.0000 mg | ORAL_TABLET | Freq: Two times a day (BID) | ORAL | Status: DC
  Administered 2017-09-18: 325 mg via ORAL
  Filled 2017-09-18: qty 1

## 2017-09-18 MED ORDER — SENNOSIDES-DOCUSATE SODIUM 8.6-50 MG PO TABS
2.0000 | ORAL_TABLET | ORAL | Status: DC
  Administered 2017-09-18: 2 via ORAL
  Filled 2017-09-18: qty 2

## 2017-09-18 MED ORDER — OXYTOCIN 40 UNITS IN LACTATED RINGERS INFUSION - SIMPLE MED
1.0000 m[IU]/min | INTRAVENOUS | Status: DC
  Administered 2017-09-18: 1 m[IU]/min via INTRAVENOUS
  Filled 2017-09-18: qty 1000

## 2017-09-18 MED ORDER — COCONUT OIL OIL: 1.0000 "application " | TOPICAL_OIL | Status: DC | PRN

## 2017-09-18 MED ORDER — MEASLES, MUMPS & RUBELLA VAC ~~LOC~~ INJ
0.5000 mL | INJECTION | Freq: Once | SUBCUTANEOUS | Status: DC
Start: 2017-09-19 — End: 2017-09-19

## 2017-09-18 MED ORDER — BENZOCAINE-MENTHOL 20-0.5 % EX AERO
1.0000 "application " | INHALATION_SPRAY | CUTANEOUS | Status: DC | PRN
  Administered 2017-09-18: 1 via TOPICAL
  Filled 2017-09-18: qty 56

## 2017-09-18 MED ORDER — FENTANYL CITRATE (PF) 100 MCG/2ML IJ SOLN: 50.0000 ug | INTRAMUSCULAR | Status: DC | PRN

## 2017-09-18 MED ORDER — DIBUCAINE 1 % RE OINT: 1.0000 "application " | TOPICAL_OINTMENT | RECTAL | Status: DC | PRN

## 2017-09-18 MED ORDER — LACTATED RINGERS IV SOLN: 500.0000 mL | Freq: Once | INTRAVENOUS | Status: DC

## 2017-09-18 MED ORDER — TETANUS-DIPHTH-ACELL PERTUSSIS 5-2.5-18.5 LF-MCG/0.5 IM SUSP: 0.5000 mL | Freq: Once | INTRAMUSCULAR | Status: DC

## 2017-09-18 MED ORDER — PHENYLEPHRINE 40 MCG/ML (10ML) SYRINGE FOR IV PUSH (FOR BLOOD PRESSURE SUPPORT)
80.0000 ug | PREFILLED_SYRINGE | INTRAVENOUS | Status: DC | PRN
  Filled 2017-09-18: qty 5
  Filled 2017-09-18: qty 10

## 2017-09-18 MED ORDER — PHENYLEPHRINE 40 MCG/ML (10ML) SYRINGE FOR IV PUSH (FOR BLOOD PRESSURE SUPPORT)
80.0000 ug | PREFILLED_SYRINGE | INTRAVENOUS | Status: DC | PRN
  Filled 2017-09-18: qty 5

## 2017-09-18 MED ORDER — DIPHENHYDRAMINE HCL 50 MG/ML IJ SOLN: 12.5000 mg | INTRAMUSCULAR | Status: DC | PRN

## 2017-09-18 MED ORDER — FENTANYL 2.5 MCG/ML BUPIVACAINE 1/10 % EPIDURAL INFUSION (WH - ANES)
14.0000 mL/h | INTRAMUSCULAR | Status: DC | PRN
  Administered 2017-09-18: 14 mL/h via EPIDURAL
  Filled 2017-09-18: qty 100

## 2017-09-18 MED ORDER — DIPHENHYDRAMINE HCL 25 MG PO CAPS: 25.0000 mg | ORAL_CAPSULE | Freq: Four times a day (QID) | ORAL | Status: DC | PRN

## 2017-09-18 MED ORDER — ONDANSETRON HCL 4 MG/2ML IJ SOLN: 4.0000 mg | Freq: Four times a day (QID) | INTRAMUSCULAR | Status: DC | PRN

## 2017-09-18 MED ORDER — LIDOCAINE HCL (PF) 1 % IJ SOLN
30.0000 mL | INTRAMUSCULAR | Status: DC | PRN
  Filled 2017-09-18: qty 30

## 2017-09-18 NOTE — Anesthesia Postprocedure Evaluation (Signed)
Anesthesia Post Note  Patient: Kimberly GantKatie S Chambers  Procedure(s) Performed: AN AD HOC LABOR EPIDURAL     Patient location during evaluation: Mother Baby Anesthesia Type: Epidural Level of consciousness: awake Pain management: satisfactory to patient Vital Signs Assessment: post-procedure vital signs reviewed and stable Respiratory status: spontaneous breathing Cardiovascular status: stable Anesthetic complications: no    Last Vitals:  Vitals:   09/18/17 1240 09/18/17 1353  BP: 134/66 110/65  Pulse: 77 87  Resp: 18 18  Temp: 36.8 C 37.1 C  SpO2:      Last Pain:  Vitals:   09/18/17 1353  TempSrc: Oral  PainSc: 1    Pain Goal: Patients Stated Pain Goal: 7 (09/18/17 0745)               Cephus ShellingBURGER,Makenzie Vittorio

## 2017-09-18 NOTE — Progress Notes (Signed)
Kimberly GantKatie S Chambers MRN: [id number]  Subjective: -Patient resting in bed.  Reports perception of contractions and states onset was recent.  Coping well.   Objective: BP 137/86   Pulse 95   Temp 98.5 F (36.9 C) (Oral)   Resp 16   Ht 5' (1.524 m)   Wt 64.9 kg (143 lb)   BMI 27.93 kg/m  No intake/output data recorded. No intake/output data recorded.  Fetal Monitoring: FHT: 135 bpm, Mod Var, -Decels, +Accels UC: palpates mild    Vaginal Exam: SVE:   Dilation: 4.5 Effacement (%): 50 Station: -2 Exam by:: Kimberly Chambers, Kimberly Chambers CNM Membranes:AROm of moderate amt clear fluid Internal Monitors: None  Augmentation/Induction: Pitocin:695mUn/min  Cytotec: None  Assessment:  IUP at 40.1wks Cat I FT  IOL Amniotomy  Plan: -Reiterated AROM r/b including increased risk of infection, cord prolapse, fetal intolerance, and decreased labor time. No questions or concerns and patient desires to proceed with AROM.  -Educated on IV pain medication usage including benefits, effects, dosage, and frequency -Continue other mgmt as ordered -Dr. Kathie RhodesS. Rivard to assume care   Kimberly CopaJessica L Kwamane Whack,MSN, CNM 09/18/2017, 6:26 AM

## 2017-09-18 NOTE — Anesthesia Procedure Notes (Signed)
Epidural Patient location during procedure: OB Start time: 09/18/2017 9:10 AM End time: 09/18/2017 9:14 AM  Staffing Anesthesiologist: Leilani AbleHATCHETT, Zahriyah Joo Performed: anesthesiologist   Preanesthetic Checklist Completed: patient identified, surgical consent, pre-op evaluation, timeout performed, IV checked, risks and benefits discussed and monitors and equipment checked  Epidural Patient position: sitting Prep: site prepped and draped and DuraPrep Patient monitoring: continuous pulse ox and blood pressure Approach: midline Location: L3-L4 Injection technique: LOR air  Needle:  Needle type: Tuohy  Needle gauge: 17 G Needle length: 9 cm and 9 Needle insertion depth: 4 cm Catheter type: closed end flexible Catheter size: 19 Gauge Catheter at skin depth: 9 cm Test dose: negative  Assessment Sensory level: T9 Events: blood not aspirated, injection not painful, no injection resistance, negative IV test and no paresthesia

## 2017-09-18 NOTE — Progress Notes (Signed)
S: doing well, coping well with pain using bedside Nitrous oxide  O: Pitocin at 6 mU/min      Contractions every 2 minutes, 8/10       Category 1 tracing      VE: 5/90/-1  A: IOL for SUA at 40+1 weeks, progressing well  P: SVD expected

## 2017-09-18 NOTE — Anesthesia Preprocedure Evaluation (Signed)
Anesthesia Evaluation  Patient identified by MRN, date of birth, ID band Patient awake    Reviewed: Allergy & Precautions, H&P , NPO status , Patient's Chart, lab work & pertinent test results  Airway Mallampati: II  TM Distance: >3 FB Neck ROM: full    Dental no notable dental hx.    Pulmonary neg pulmonary ROS,    Pulmonary exam normal breath sounds clear to auscultation       Cardiovascular negative cardio ROS Normal cardiovascular exam Rhythm:regular Rate:Normal     Neuro/Psych negative neurological ROS  negative psych ROS   GI/Hepatic negative GI ROS, Neg liver ROS,   Endo/Other  negative endocrine ROS  Renal/GU negative Renal ROS     Musculoskeletal   Abdominal Normal abdominal exam  (+)   Peds  Hematology negative hematology ROS (+)   Anesthesia Other Findings   Reproductive/Obstetrics (+) Pregnancy                             Anesthesia Physical Anesthesia Plan  ASA: II  Anesthesia Plan: Epidural   Post-op Pain Management:    Induction:   PONV Risk Score and Plan:   Airway Management Planned:   Additional Equipment:   Intra-op Plan:   Post-operative Plan:   Informed Consent: I have reviewed the patients History and Physical, chart, labs and discussed the procedure including the risks, benefits and alternatives for the proposed anesthesia with the patient or authorized representative who has indicated his/her understanding and acceptance.       Plan Discussed with:   Anesthesia Plan Comments:         Anesthesia Quick Evaluation  

## 2017-09-19 LAB — CBC
HEMATOCRIT: 29.3 % — AB (ref 36.0–46.0)
HEMOGLOBIN: 10.3 g/dL — AB (ref 12.0–15.0)
MCH: 33.6 pg (ref 26.0–34.0)
MCHC: 35.2 g/dL (ref 30.0–36.0)
MCV: 95.4 fL (ref 78.0–100.0)
Platelets: 152 10*3/uL (ref 150–400)
RBC: 3.07 MIL/uL — AB (ref 3.87–5.11)
RDW: 13.2 % (ref 11.5–15.5)
WBC: 18 10*3/uL — AB (ref 4.0–10.5)

## 2017-09-19 NOTE — Discharge Summary (Signed)
OB Discharge Summary     Patient Name: Kimberly Chambers DOB: 09-16-1986 MRN: [id number]  Date of admission: 09/18/2017 Delivering MD: Silverio Lay   Date of discharge: 09/19/2017  Admitting diagnosis: INDUCTION Intrauterine pregnancy: [redacted]w[redacted]d     Secondary diagnosis:  Active Problems:   Indication for care or intervention in labor or delivery  Additional problems: SUA     Discharge diagnosis: Term Pregnancy Delivered                                                                                                Post partum procedures:None  Augmentation: AROM and Pitocin  Complications: None  Hospital course:  Induction of Labor With Vaginal Delivery   31 y.o. yo G2P2002 at [redacted]w[redacted]d was admitted to the hospital 09/18/2017 for induction of labor.  Indication for induction: SUA.  Patient had an uncomplicated labor course as follows: Membrane Rupture Time/Date: 6:15 AM ,09/18/2017   Intrapartum Procedures: Episiotomy: None [1]                                         Lacerations:     Patient had delivery of a Viable infant.  Information for the patient's newborn:  [name], Chambers [[id number]]  Delivery Method: Vaginal, Spontaneous Delivery (Filed from Delivery Summary)   09/18/2017  Details of delivery can be found in separate delivery note.  Patient had a routine postpartum course. Patient is discharged home 09/19/17.  Physical exam  Vitals:   09/18/17 1206 09/18/17 1240 09/18/17 1353 09/18/17 1822  BP: 125/77 134/66 110/65 118/70  Pulse: 66 77 87 73  Resp: 18 18 18 18   Temp:  98.2 F (36.8 C) 98.8 F (37.1 C) 98.1 F (36.7 C)  TempSrc:  Oral Oral Oral  SpO2:      Weight:      Height:       General: alert, cooperative and no distress Lochia: appropriate Uterine Fundus: firm Incision: N/A DVT Evaluation: No evidence of DVT seen on physical exam. Negative Homan's sign. Labs: Lab Results  Component Value Date   WBC 12.7 (H) 09/18/2017   HGB 11.2 (L) 09/18/2017   HCT 32.5 (L) 09/18/2017   MCV 94.5 09/18/2017   PLT 193 09/18/2017   CMP Latest Ref Rng & Units 04/03/2014  Glucose 70 - 99 mg/dL 782(N)  BUN 6 - 23 mg/dL 14  Creatinine 5.62 - 1.30 mg/dL 8.65  Sodium 784 - 696 mEq/L 141  Potassium 3.7 - 5.3 mEq/L 3.9  Chloride 96 - 112 mEq/L 102  CO2 19 - 32 mEq/L 27  Calcium 8.4 - 10.5 mg/dL 29.5    Discharge instruction: per After Visit Summary and "Baby and Me Booklet".  After visit meds:  PNV Ibuprofen  Diet: routine diet  Activity: Advance as tolerated. Pelvic rest for 6 weeks.   Outpatient follow up:6 weeks Follow up Appt:No future appointments. Follow up Visit:No Follow-up on file.  Postpartum contraception: Vascetomy  Newborn Data: Live born female  Birth Weight: 6 lb  5.2 oz (2870 g) APGAR: 9, 9  Newborn Delivery   Birth date/time:  09/18/2017 10:19:00 Delivery type:  Vaginal, Spontaneous Delivery      Baby Feeding: Breast Disposition:home with mother   09/19/2017 Kimberly Chambers, CNM

## 2017-09-19 NOTE — Lactation Note (Signed)
This note was copied from a baby's chart. Lactation Consultation Note  Patient Name: Kimberly Chambers JXBJY'NToday's Date: 09/19/2017 Reason for consult: Follow-up assessment;Infant weight loss  Baby is 5025 hours old , grand parents and dad visiting , mom gave  LC permission to talk in front of the them about breast feeding.  Experienced breast feeding mother Per mom feeling sore initially when latching and using the cradle position to  Latch , breast are feeling heavier, and hearing lots of swallows.  Baby sleeping presently and not showing signs of hunger.  LC reviewed positioning and made recommendation to use the cross cradle and  Switch arms to the cradle after the baby is in a consistent swallowing pattern.  Or football. After the baby develops a strong ceck muscles the cradle will be fine to use to start.  Sore nipple and engorgement prevention and tx reviewed.  LC instructed mom on the use of breast shells, and per mom has a hand pump and DEBP at home.  Mother informed of post-discharge support and given phone number to the lactation department, including services for phone call assistance; out-patient appointments; and breastfeeding support group. List of other breastfeeding resources in the community given in the handout. Encouraged mother to call for problems or concerns related to breastfeeding.   Maternal Data Has patient been taught Hand Expression?: Yes Does the patient have breastfeeding experience prior to this delivery?: Yes  Feeding Feeding Type: Breast Fed Length of feed: 20 min  LATCH Score                   Interventions Interventions: Breast feeding basics reviewed;Shells  Lactation Tools Discussed/Used Tools: Shells (momk declined hand pump has one at home ) Shell Type: Inverted WIC Program: No Pump Review: Setup, frequency, and cleaning;Milk Storage Initiated by:: MAI  Date initiated:: 09/19/17   Consult Status Consult Status: Complete Date:  09/19/17    Kimberly Chambers 09/19/2017, 11:40 AM

## 2017-09-19 NOTE — Progress Notes (Signed)
CSW attempted to meet with MOB due to score of 10 on Edinburgh Postnatal Depression Scale, but baby was having PKU done at this time and CSW was asked to return at a later time.  CSW will attempt again later today if possible.

## 2017-09-19 NOTE — Progress Notes (Signed)
CSW notified by RN that MOB and baby are scheduled for early discharge, of which CSW was not previously aware.  CSW is unavailable to meet with MOB at this time and asked that if MOB wishes to leave prior to CSW's availability that RN please review s/s of PMADs and the importance of self-evaluation and reporting of concerning symptoms with MOB prior to discharge.  RN agreed.  CSW appreciates RN assistance.   

## 2017-12-20 DIAGNOSIS — F53 Postpartum depression: Secondary | ICD-10-CM | POA: Diagnosis not present

## 2019-02-01 NOTE — Progress Notes (Signed)
[facility] [address] [place], [place] [address] Phone: [phone] Subjective:   Bruce Donath, am serving as a scribe for Dr. Antoine Primas.   CC: Right arm pain  NOT:[id number]  Kimberly Chambers is a 33 y.o. female coming in with complaint of right arm, bicep pain. Feels like the muscle is strained. Has had pain for 3 weeks. Patient does not have one time when she injured. Pronation and flexion causes her pain. Has had some pain in the posterior shoulder as well. Is having both sharp and dull pain.  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  Denies any nighttime pain.     Past Medical History:  Diagnosis Date  . Anxiety   . H/O candidiasis   . H/O varicella   . Hearing loss   . PONV (postoperative nausea and vomiting)   . Sore throat    Past Surgical History:  Procedure Laterality Date  . LINGUAL FRENECTOMY    . TONSILLECTOMY Bilateral 05/12/2015   Procedure: TONSILLECTOMY;  Surgeon: Melvenia Beam, MD;  Location: [facility] OR;  Service: ENT;  Laterality: Bilateral;  . TYMPANOSTOMY TUBE PLACEMENT     as child   . WISDOM TOOTH EXTRACTION     x4, 2002   Social History   Socioeconomic History  . Marital status: Married    Spouse name: Not on file  . Number of children: Not on file  . Years of education: Not on file  . Highest education level: Not on file  Occupational History  . Not on file  Social Needs  . Financial resource strain: Not on file  . Food insecurity:    Worry: Not on file    Inability: Not on file  . Transportation needs:    Medical: Not on file    Non-medical: Not on file  Tobacco Use  . Smoking status: Never Smoker  . Smokeless tobacco: Never Used  Substance and Sexual Activity  . Alcohol use: Yes    Comment: socially  . Drug use: No  . Sexual activity: Yes    Partners: Male    Birth control/protection: Abstinence  Lifestyle  . Physical activity:   Days per week: Not on file    Minutes per session: Not on file  . Stress: Not on file  Relationships  . Social connections:    Talks on phone: Not on file    Gets together: Not on file    Attends religious service: Not on file    Active member of club or organization: Not on file    Attends meetings of clubs or organizations: Not on file    Relationship status: Not on file  Other Topics Concern  . Not on file  Social History Narrative  . Not on file   Allergies  Allergen Reactions  . Amoxicillin-Pot Clavulanate Hives and Swelling    Childhood reaction- Has patient had a PCN reaction causing immediate rash, facial/tongue/throat swelling, SOB or lightheadedness with hypotension: Yes Has patient had a PCN reaction causing severe rash involving mucus membranes or skin necrosis: No Has patient had a PCN reaction that required hospitalization: No Has patient had a PCN reaction occurring within the last 10 years: No If all of the above answers are "NO", then may proceed with Cephalosporin use.   Corky Sox [Cefaclor] Hives and Swelling    Childhood reaction- hives, slight throat swelling  .  Eggs Or Egg-Derived Products    Family History  Problem Relation Age of Onset  . Cancer Maternal Aunt   . Hypertension Maternal Grandmother   . Stroke Maternal Grandmother   . Hypertension Maternal Grandfather   . Parkinson's disease Maternal Grandfather   . Cancer Paternal Grandmother        Current Outpatient Medications (Analgesics):  .  meloxicam (MOBIC) 15 MG tablet, Take 1 tablet (15 mg total) by mouth daily.      Past medical history, social, surgical and family history all reviewed in electronic medical record.  No pertanent information unless stated regarding to the chief complaint.   Review of Systems:  No headache, visual changes, nausea, vomiting, diarrhea, constipation, dizziness, abdominal pain, skin rash, fevers, chills, night sweats, weight loss, swollen lymph nodes,  body aches, joint swelling,  chest pain, shortness of breath, mood changes.  Positive muscle aches  Objective  Blood pressure 104/66, pulse 77, height 5' (1.524 m), weight 116 lb (52.6 kg), SpO2 99 %, unknown if currently breastfeeding.   General: No apparent distress alert and oriented x3 mood and affect normal, dressed appropriately.  HEENT: Pupils equal, extraocular movements intact  Respiratory: Patient's speak in full sentences and does not appear short of breath  Cardiovascular: No lower extremity edema, non tender, no erythema  Skin: Warm dry intact with no signs of infection or rash on extremities or on axial skeleton.  Abdomen: Soft nontender  Neuro: Cranial nerves II through XII are intact, neurovascularly intact in all extremities with 2+ DTRs and 2+ pulses.  Lymph: No lymphadenopathy of posterior or anterior cervical chain or axillae bilaterally.  Gait mild antalgic gait MSK:  Non tender with full range of motion and good stability and symmetric strength and tone of  elbows, wrist, hip, knee and ankles bilaterally.   Right shoulder exam shows no significant abnormality on inspection.  Mild positive impingement noted.  Mild positive crossover noted as well.  Full strength of the rotator cuff noted compared to the contralateral side.  Neurovascular intact distally.  MSK US performed of: Right shoulder This study was ordered, performed, and interpreted by Terrilee Files D.O.  Shoulder:   Supraspinatus:  Appears normal on long and transverse views, bursal bulge noted with impingement view Infraspinatus:  Appears normal on long and transverse views. Subscapularis:  Appears normal on long and transverse views. AC joint: Mild inflammation Glenohumeral Joint:  Appears normal without effusion. Glenoid Labrum:  Intact without visualized tears. Biceps Tendon:  Appears normal on long and transverse views, no fraying of tendon, tendon located in intertubercular groove, no subluxation with  shoulder internal or external rotation. No increased power doppler signal. Impression: Mild subacromial bursitis    Impression and Recommendations:     This case required medical decision making of moderate complexity. The above documentation has been reviewed and is accurate and complete Judi Saa, DO       Note: This dictation was prepared with Dragon dictation along with smaller phrase technology. Any transcriptional errors that result from this process are unintentional.

## 2019-02-03 ENCOUNTER — Ambulatory Visit (INDEPENDENT_AMBULATORY_CARE_PROVIDER_SITE_OTHER): Payer: 59 | Admitting: Family Medicine

## 2019-02-03 ENCOUNTER — Encounter: Payer: Self-pay | Admitting: Family Medicine

## 2019-02-03 ENCOUNTER — Ambulatory Visit: Payer: Self-pay

## 2019-02-03 ENCOUNTER — Other Ambulatory Visit: Payer: Self-pay

## 2019-02-03 VITALS — BP 104/66 | HR 77 | Ht 60.0 in | Wt 116.0 lb

## 2019-02-03 DIAGNOSIS — M79601 Pain in right arm: Secondary | ICD-10-CM

## 2019-02-03 DIAGNOSIS — M7551 Bursitis of right shoulder: Secondary | ICD-10-CM

## 2019-02-03 MED ORDER — MELOXICAM 15 MG PO TABS: 15.0000 mg | ORAL_TABLET | Freq: Every day | ORAL | 0 refills | Status: AC

## 2019-02-03 NOTE — Patient Instructions (Signed)
Good to see you  Ice 20 minutes 2 times daily. Usually after activity and before bed. Meloxicam daily for 10 days then as needed Keep hands within peripheral vision  See me again in 5-6 weeks and if not better we will consider injection

## 2019-02-03 NOTE — Assessment & Plan Note (Signed)
Home exercises given, discussed icing regimen, patient held on any type of injection.  Discussed which activities to do which wants to avoid.  Increase activity slowly over the course of next several days.  Follow-up again in 4 to 8 weeks

## 2019-03-10 ENCOUNTER — Ambulatory Visit: Payer: 59 | Admitting: Family Medicine

## 2019-08-12 ENCOUNTER — Ambulatory Visit: Payer: 59 | Admitting: Family Medicine

## 2019-09-04 ENCOUNTER — Ambulatory Visit: Payer: 59 | Admitting: Family Medicine
